# Patient Record
Sex: Female | Born: 1995 | Hispanic: Yes | Smoking: Light tobacco smoker
Health system: Southern US, Community
[De-identification: ages and names within clinical notes are randomized; demographics above are authoritative.]

## PROBLEM LIST (undated history)

## (undated) DIAGNOSIS — T7840XA Allergy, unspecified, initial encounter: Secondary | ICD-10-CM

## (undated) DIAGNOSIS — K219 Gastro-esophageal reflux disease without esophagitis: Secondary | ICD-10-CM

## (undated) DIAGNOSIS — F419 Anxiety disorder, unspecified: Secondary | ICD-10-CM

## (undated) DIAGNOSIS — L309 Dermatitis, unspecified: Secondary | ICD-10-CM

## (undated) DIAGNOSIS — E669 Obesity, unspecified: Secondary | ICD-10-CM

## (undated) DIAGNOSIS — J45909 Unspecified asthma, uncomplicated: Secondary | ICD-10-CM

## (undated) DIAGNOSIS — I10 Essential (primary) hypertension: Secondary | ICD-10-CM

## (undated) DIAGNOSIS — F32A Depression, unspecified: Secondary | ICD-10-CM

## (undated) DIAGNOSIS — D689 Coagulation defect, unspecified: Secondary | ICD-10-CM

## (undated) DIAGNOSIS — D649 Anemia, unspecified: Secondary | ICD-10-CM

## (undated) HISTORY — DX: Dermatitis, unspecified: L30.9

## (undated) HISTORY — DX: Anemia, unspecified: D64.9

## (undated) HISTORY — PX: HERNIA REPAIR: SHX51

## (undated) HISTORY — DX: Anxiety disorder, unspecified: F41.9

## (undated) HISTORY — DX: Depression, unspecified: F32.A

## (undated) HISTORY — DX: Allergy, unspecified, initial encounter: T78.40XA

## (undated) HISTORY — DX: Essential (primary) hypertension: I10

## (undated) HISTORY — DX: Gastro-esophageal reflux disease without esophagitis: K21.9

## (undated) HISTORY — DX: Coagulation defect, unspecified: D68.9

## (undated) HISTORY — DX: Unspecified asthma, uncomplicated: J45.909

---
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
## 2017-08-18 ENCOUNTER — Emergency Department (HOSPITAL_COMMUNITY)
Admission: EM | Admit: 2017-08-18 | Discharge: 2017-08-18 | Disposition: A | Discharge status: Home / Self Care | Payer: Self-pay | Attending: Emergency Medicine | Admitting: Emergency Medicine

## 2017-08-18 ENCOUNTER — Encounter (HOSPITAL_COMMUNITY): Payer: Self-pay | Admitting: *Deleted

## 2017-08-18 DIAGNOSIS — L0291 Cutaneous abscess, unspecified: Secondary | ICD-10-CM

## 2017-08-18 DIAGNOSIS — F17200 Nicotine dependence, unspecified, uncomplicated: Secondary | ICD-10-CM | POA: Insufficient documentation

## 2017-08-18 DIAGNOSIS — Z79899 Other long term (current) drug therapy: Secondary | ICD-10-CM | POA: Insufficient documentation

## 2017-08-18 DIAGNOSIS — L0201 Cutaneous abscess of face: Secondary | ICD-10-CM | POA: Insufficient documentation

## 2017-08-18 HISTORY — DX: Obesity, unspecified: E66.9

## 2017-08-18 MED ORDER — SULFAMETHOXAZOLE-TRIMETHOPRIM 800-160 MG PO TABS
2.0000 | ORAL_TABLET | Freq: Once | ORAL | Status: AC
  Administered 2017-08-18: 2 via ORAL
  Filled 2017-08-18: qty 2

## 2017-08-18 MED ORDER — LIDOCAINE HCL 2 % IJ SOLN
20.0000 mL | Freq: Once | INTRAMUSCULAR | Status: AC
  Administered 2017-08-18: 400 mg
  Filled 2017-08-18: qty 20

## 2017-08-18 MED ORDER — SULFAMETHOXAZOLE-TRIMETHOPRIM 800-160 MG PO TABS: 1.0000 | ORAL_TABLET | Freq: Two times a day (BID) | ORAL | 0 refills | Status: AC

## 2017-08-18 MED ORDER — OXYCODONE-ACETAMINOPHEN 5-325 MG PO TABS
1.0000 | ORAL_TABLET | Freq: Once | ORAL | Status: AC
Start: 2017-08-18 — End: 2017-08-18
  Administered 2017-08-18: 1 via ORAL
  Filled 2017-08-18: qty 1

## 2017-08-18 MED ORDER — OXYCODONE-ACETAMINOPHEN 5-325 MG PO TABS: 1.0000 | ORAL_TABLET | ORAL | 0 refills | Status: DC | PRN

## 2017-08-18 NOTE — ED Provider Notes (Signed)
[facility] EMERGENCY DEPARTMENT Provider Note   CSN: [id number] Arrival date & time: 08/18/17  1732     History   Chief Complaint Chief Complaint  Patient presents with  . Abscess    HPI Tyreshia Troutman is a 21 y.o. female.  HPI   21 year old female presents today with 3-day history of abscess.  Patient notes she had a pimple under her left nostril.  She notes she attempted popping it causing worsening swelling and pain.  She notes swelling down into her lip, denies any intraoral involvement.  She reports generalized headache, denies any fever chills nausea or vomiting.  No history of the same, no chronic health conditions, patient is not a diabetic.  No medications prior to arrival.  Past Medical History:  Diagnosis Date  . Obesity     There are no active problems to display for this patient.   History reviewed. No pertinent surgical history.  OB History    No data available       Home Medications    Prior to Admission medications   Medication Sig Start Date End Date Taking? Authorizing Provider  Aspirin-Salicylamide-Caffeine (BC FAST PAIN RELIEF) 650-195-33.3 MG PACK Take 1 application by mouth daily as needed.   Yes [provider]  Biotin 10 MG CAPS Take 10 mg by mouth daily.   Yes [provider]  diphenhydrAMINE (BENADRYL) 25 MG tablet Take 50 mg by mouth every 6 (six) hours as needed for itching.   Yes [provider]  ibuprofen (ADVIL,MOTRIN) 200 MG tablet Take 600 mg by mouth every 6 (six) hours as needed.   Yes [provider]  oxyCODONE-acetaminophen (PERCOCET/ROXICET) 5-325 MG tablet Take 1-2 tablets by mouth every 4 (four) hours as needed for severe pain. 08/18/17   Rishan Oyama, Tinnie Gens, PA-C  sulfamethoxazole-trimethoprim (BACTRIM DS,SEPTRA DS) 800-160 MG tablet Take 1 tablet by mouth 2 (two) times daily. 08/18/17 08/25/17  Eyvonne Mechanic, PA-C    Family History History reviewed. No pertinent family  history.  Social History Social History  Substance Use Topics  . Smoking status: Current Every Day Smoker  . Smokeless tobacco: Not on file  . Alcohol use No     Allergies   Patient has no known allergies.   Review of Systems Review of Systems  All other systems reviewed and are negative.    Physical Exam Updated Vital Signs BP 131/81 (BP Location: Left Arm)   Pulse 91   Temp 98.9 F (37.2 C) (Oral)   Resp (!) 98   SpO2 98%   Physical Exam  Constitutional: She is oriented to person, place, and time. She appears well-developed and well-nourished.  HENT:  Head: Normocephalic and atraumatic.  Eyes: Pupils are equal, round, and reactive to light. Conjunctivae are normal. Right eye exhibits no discharge. Left eye exhibits no discharge. No scleral icterus.  Neck: Normal range of motion. No JVD present. No tracheal deviation present.  Pulmonary/Chest: Effort normal. No stridor.  Neurological: She is alert and oriented to person, place, and time. Coordination normal.  Skin:  1.5 centimeters area of induration with minor redness under the left nostril-no intraoral involvement-no swelling to the face or peri-orbital structures-no purulent discharge  Psychiatric: She has a normal mood and affect. Her behavior is normal. Judgment and thought content normal.  Nursing note and vitals reviewed.    ED Treatments / Results  Labs (all labs ordered are listed, but only abnormal results are displayed) Labs Reviewed - No data to display  EKG  EKG Interpretation None       Radiology No results found.  Procedures .Marland Kitchen.Incision and Drainage Date/Time: 08/18/2017 8:50 PM Performed by: Curlene DolphinHEDGES, Jasher Barkan Authorized by: Curlene DolphinHEDGES, Lonnie Reth   Consent:    Consent obtained:  Verbal   Consent given by:  Patient   Risks discussed:  Pain and bleeding   Alternatives discussed:  No treatment, delayed treatment, observation and alternative treatment Location:    Type:  Abscess   Size:   1.5   Location:  Head   Head location:  Face Pre-procedure details:    Procedure prep: alcohol prep. Anesthesia (see MAR for exact dosages):    Anesthesia method:  Local infiltration   Local anesthetic:  Lidocaine 2% w/o epi Procedure type:    Complexity:  Simple Procedure details:    Incision types:  Stab incision   Incision depth:  Dermal   Scalpel blade:  11   Wound management:  Probed and deloculated   Drainage:  Bloody   Drainage amount:  Scant   Wound treatment:  Wound left open   Packing materials:  None Post-procedure details:    Patient tolerance of procedure:  Tolerated well, no immediate complications   (including critical care time)  Medications Ordered in ED Medications  sulfamethoxazole-trimethoprim (BACTRIM DS,SEPTRA DS) 800-160 MG per tablet 2 tablet (not administered)  lidocaine (XYLOCAINE) 2 % (with pres) injection 400 mg (400 mg Infiltration Given by Other 08/18/17 1950)  oxyCODONE-acetaminophen (PERCOCET/ROXICET) 5-325 MG per tablet 1 tablet (1 tablet Oral Given 08/18/17 1954)     Initial Impression / Assessment and Plan / ED Course  I have reviewed the triage vital signs and the nursing notes.  Pertinent labs & imaging results that were available during my care of the patient were reviewed by me and considered in my medical decision making (see chart for details).      Final Clinical Impressions(s) / ED Diagnoses   Final diagnoses:  Abscess   21 year old female presents today with an abscess to her face.  She has minor redness, no surrounding redness.  I&D showed no purulent drainage.  Patient has no spread throughout the face, very unlikely to have any extension into the cavernous sinus, she is afebrile nontoxic in no acute distress.  Patient will be started on Bactrim, she will follow-up closely in the ED with any worsening signs or symptoms.  Both the patient and her mother verbalized understanding and agreement to today's plan had no further  questions or concerns the time discharge.   Cavernous sinus  New Prescriptions New Prescriptions   OXYCODONE-ACETAMINOPHEN (PERCOCET/ROXICET) 5-325 MG TABLET    Take 1-2 tablets by mouth every 4 (four) hours as needed for severe pain.   SULFAMETHOXAZOLE-TRIMETHOPRIM (BACTRIM DS,SEPTRA DS) 800-160 MG TABLET    Take 1 tablet by mouth 2 (two) times daily.     Eyvonne MechanicHedges, Sarely Stracener, PA-C 08/18/17 2053    Rolland PorterJames, Mark, MD 08/23/17 0001

## 2017-08-18 NOTE — Discharge Instructions (Signed)
Please read attached information. If you experience any new or worsening signs or symptoms please return to the emergency room for evaluation. Please follow-up with your primary care provider or specialist as discussed. Please use medication prescribed only as directed and discontinue taking if you have any concerning signs or symptoms.   °

## 2017-08-18 NOTE — ED Notes (Signed)
Pt stable,ambulatory, and verbalizes understanding of D/C instructions.   

## 2017-08-18 NOTE — ED Triage Notes (Signed)
Pt reports having a bump near her nose for several days, has gotten bigger and has swelling to her upper lip and difficulty eating. Denies fever. airway intact.

## 2017-08-20 ENCOUNTER — Encounter (HOSPITAL_COMMUNITY): Payer: Self-pay | Admitting: Emergency Medicine

## 2017-08-20 ENCOUNTER — Emergency Department (HOSPITAL_COMMUNITY)
Admission: EM | Admit: 2017-08-20 | Discharge: 2017-08-20 | Disposition: A | Discharge status: Home / Self Care | Payer: Self-pay | Attending: Emergency Medicine | Admitting: Emergency Medicine

## 2017-08-20 DIAGNOSIS — L0291 Cutaneous abscess, unspecified: Secondary | ICD-10-CM

## 2017-08-20 DIAGNOSIS — Z79899 Other long term (current) drug therapy: Secondary | ICD-10-CM | POA: Insufficient documentation

## 2017-08-20 DIAGNOSIS — L03211 Cellulitis of face: Secondary | ICD-10-CM | POA: Insufficient documentation

## 2017-08-20 DIAGNOSIS — F17200 Nicotine dependence, unspecified, uncomplicated: Secondary | ICD-10-CM | POA: Insufficient documentation

## 2017-08-20 DIAGNOSIS — L0201 Cutaneous abscess of face: Secondary | ICD-10-CM | POA: Insufficient documentation

## 2017-08-20 LAB — COMPREHENSIVE METABOLIC PANEL
ALBUMIN: 3.4 g/dL — AB (ref 3.5–5.0)
ALT: 18 U/L (ref 14–54)
ANION GAP: 6 (ref 5–15)
AST: 34 U/L (ref 15–41)
Alkaline Phosphatase: 97 U/L (ref 38–126)
BUN: 12 mg/dL (ref 6–20)
CHLORIDE: 105 mmol/L (ref 101–111)
CO2: 25 mmol/L (ref 22–32)
Calcium: 8.9 mg/dL (ref 8.9–10.3)
Creatinine, Ser: 0.72 mg/dL (ref 0.44–1.00)
GFR calc Af Amer: >60 mL/min (ref >60)
GFR calc non Af Amer: >60 mL/min (ref >60)
GLUCOSE: 95 mg/dL (ref 65–99)
POTASSIUM: 5.2 mmol/L — AB (ref 3.5–5.1)
SODIUM: 136 mmol/L (ref 135–145)
Total Bilirubin: 1.5 mg/dL — ABNORMAL HIGH (ref 0.3–1.2)
Total Protein: 6.7 g/dL (ref 6.5–8.1)

## 2017-08-20 LAB — CBC WITH DIFFERENTIAL/PLATELET
BASOS PCT: 0 %
Basophils Absolute: 0 10*3/uL (ref 0.0–0.1)
EOS ABS: 0.1 10*3/uL (ref 0.0–0.7)
EOS PCT: 1 %
HCT: 43.5 % (ref 36.0–46.0)
HEMOGLOBIN: 14.1 g/dL (ref 12.0–15.0)
Lymphocytes Relative: 11 %
Lymphs Abs: 1.8 10*3/uL (ref 0.7–4.0)
MCH: 28.9 pg (ref 26.0–34.0)
MCHC: 32.4 g/dL (ref 30.0–36.0)
MCV: 89.1 fL (ref 78.0–100.0)
MONOS PCT: 5 %
Monocytes Absolute: 0.8 10*3/uL (ref 0.1–1.0)
NEUTROS ABS: 13.9 10*3/uL — AB (ref 1.7–7.7)
Neutrophils Relative %: 83 %
PLATELETS: 198 10*3/uL (ref 150–400)
RBC: 4.88 MIL/uL (ref 3.87–5.11)
RDW: 16.3 % — AB (ref 11.5–15.5)
WBC: 16.6 10*3/uL — ABNORMAL HIGH (ref 4.0–10.5)

## 2017-08-20 LAB — I-STAT CG4 LACTIC ACID, ED: Lactic Acid, Venous: 1.28 mmol/L (ref 0.5–1.9)

## 2017-08-20 MED ORDER — ACETAMINOPHEN 500 MG PO TABS
1000.0000 mg | ORAL_TABLET | Freq: Once | ORAL | Status: AC
  Administered 2017-08-20: 1000 mg via ORAL

## 2017-08-20 MED ORDER — CEFTRIAXONE SODIUM 1 G IJ SOLR: 1.0000 g | Freq: Once | INTRAMUSCULAR | 0 refills | Status: AC

## 2017-08-20 MED ORDER — LIDOCAINE HCL (PF) 1 % IJ SOLN
INTRAMUSCULAR | Status: AC
  Administered 2017-08-20: 2.1 mL
  Filled 2017-08-20: qty 5

## 2017-08-20 MED ORDER — CEFTRIAXONE SODIUM 1 G IJ SOLR: 1.0000 g | Freq: Once | INTRAMUSCULAR | Status: DC

## 2017-08-20 MED ORDER — HYDROCODONE-ACETAMINOPHEN 5-325 MG PO TABS: 2.0000 | ORAL_TABLET | ORAL | 0 refills | Status: DC | PRN

## 2017-08-20 MED ORDER — LIDOCAINE HCL (PF) 1 % IJ SOLN
INTRAMUSCULAR | Status: AC
  Administered 2017-08-20: 5 mL
  Filled 2017-08-20: qty 5

## 2017-08-20 MED ORDER — ACETAMINOPHEN 500 MG PO TABS
ORAL_TABLET | ORAL | Status: AC
  Filled 2017-08-20: qty 2

## 2017-08-20 MED ORDER — CEFTRIAXONE SODIUM 1 G IJ SOLR
1.0000 g | Freq: Once | INTRAMUSCULAR | Status: AC
  Administered 2017-08-20: 1 g via INTRAMUSCULAR
  Filled 2017-08-20: qty 10

## 2017-08-20 NOTE — ED Notes (Signed)
Pt departed in NAD, refused use of wheelchair.  

## 2017-08-20 NOTE — Discharge Instructions (Signed)
Return if any problems.  Warm compresses 20 minutes 4 times a day.  Recheck at Urgent care tomorrow.  Continue antibiotics and pain medications.

## 2017-08-20 NOTE — ED Triage Notes (Signed)
Pt reports having abscess at base of L Nare drained at this ED on Wednesday, reports taking abx, but swelling worse, spreading to bilat cheeks.  Pt afebrile at triage.

## 2017-08-20 NOTE — ED Provider Notes (Signed)
[facility] Provider Note   CSN: [id number] Arrival date & time: 08/20/17  1314     History   Chief Complaint Chief Complaint  Patient presents with  . Facial Swelling    HPI Denise Beltran is a 21 y.o. female.  The history is provided by the patient. No language interpreter was used.  Abscess  Location:  Face Facial abscess location:  Face and nose Size:  2 Abscess quality: painful, redness and warmth   Red streaking: no   Duration:  4 days Progression:  Worsening Pain details:    Quality:  Throbbing and pressure   Severity:  Moderate   Timing:  Constant   Progression:  Worsening Chronicity:  New Relieved by:  Nothing Worsened by:  Nothing Ineffective treatments:  None tried  Pt had I and D 2 days ago.  Pt reports nothing drained out.  Pt reports increased swelling and pain.   Past Medical History:  Diagnosis Date  . Obesity     There are no active problems to display for this patient.   History reviewed. No pertinent surgical history.  OB History    No data available       Home Medications    Prior to Admission medications   Medication Sig Start Date End Date Taking? Authorizing Provider  Aspirin-Salicylamide-Caffeine (BC FAST PAIN RELIEF) 650-195-33.3 MG PACK Take 1 application by mouth daily as needed.    [provider]  Biotin 10 MG CAPS Take 10 mg by mouth daily.    [provider]  cefTRIAXone (ROCEPHIN) 1 g injection Inject 1 g into the muscle once. 08/20/17 08/20/17  Elson AreasSofia, Katelin Kutsch K, PA-C  diphenhydrAMINE (BENADRYL) 25 MG tablet Take 50 mg by mouth every 6 (six) hours as needed for itching.    [provider]  HYDROcodone-acetaminophen (NORCO/VICODIN) 5-325 MG tablet Take 2 tablets by mouth every 4 (four) hours as needed. 08/20/17   Elson AreasSofia, Stevana Dufner K, PA-C  ibuprofen (ADVIL,MOTRIN) 200 MG tablet Take 600 mg by mouth every 6 (six) hours as needed.    [provider]    oxyCODONE-acetaminophen (PERCOCET/ROXICET) 5-325 MG tablet Take 1-2 tablets by mouth every 4 (four) hours as needed for severe pain. 08/18/17   Hedges, Tinnie GensJeffrey, PA-C  sulfamethoxazole-trimethoprim (BACTRIM DS,SEPTRA DS) 800-160 MG tablet Take 1 tablet by mouth 2 (two) times daily. 08/18/17 08/25/17  Eyvonne MechanicHedges, Jeffrey, PA-C    Family History No family history on file.  Social History Social History  Substance Use Topics  . Smoking status: Current Every Day Smoker  . Smokeless tobacco: Not on file  . Alcohol use No     Allergies   Patient has no known allergies.   Review of Systems Review of Systems  All other systems reviewed and are negative.    Physical Exam Updated Vital Signs BP (!) 132/94 (BP Location: Right Arm)   Pulse 76   Temp 98.9 F (37.2 C) (Oral)   Resp 16   SpO2 100%   Physical Exam  Constitutional: She appears well-developed and well-nourished. No distress.  HENT:  Head: Normocephalic and atraumatic.  2cm area of swelling under left nostril    Eyes: Conjunctivae are normal.  Neck: Neck supple.  Cardiovascular: Normal rate.   No murmur heard. Pulmonary/Chest: Effort normal. No respiratory distress.  Abdominal: There is no tenderness.  Musculoskeletal: She exhibits no edema.  Neurological: She is alert.  Skin: Skin is warm and dry.  Psychiatric: She has a normal mood and  affect.  Nursing note and vitals reviewed.    ED Treatments / Results  Labs (all labs ordered are listed, but only abnormal results are displayed) Labs Reviewed  COMPREHENSIVE METABOLIC PANEL - Abnormal; Notable for the following:       Result Value   Potassium 5.2 (*)    Albumin 3.4 (*)    Total Bilirubin 1.5 (*)    All other components within normal limits  CBC WITH DIFFERENTIAL/PLATELET - Abnormal; Notable for the following:    WBC 16.6 (*)    RDW 16.3 (*)    Neutro Abs 13.9 (*)    All other components within normal limits  I-STAT CG4 LACTIC ACID, ED  I-STAT CG4  LACTIC ACID, ED    EKG  EKG Interpretation None       Radiology No results found.  Procedures .Marland KitchenIncision and Drainage Date/Time: 08/20/2017 8:07 PM Performed by: Elson Areas Authorized by: Elson Areas   Consent:    Consent obtained:  Verbal   Consent given by:  Patient   Risks discussed:  Incomplete drainage   Alternatives discussed:  No treatment Location:    Type:  Abscess   Size:  2 Pre-procedure details:    Skin preparation:  Betadine Anesthesia (see MAR for exact dosages):    Anesthesia method:  None Procedure type:    Complexity:  Simple Procedure details:    Incision types:  Single straight   Incision depth:  Subcutaneous   Scalpel blade:  11   Wound management:  Probed and deloculated   Drainage:  Purulent   Wound treatment:  Wound left open Post-procedure details:    Patient tolerance of procedure:  Tolerated well, no immediate complications   (including critical care time)  Medications Ordered in ED Medications  acetaminophen (TYLENOL) 500 MG tablet (not administered)  lidocaine (PF) (XYLOCAINE) 1 % injection (not administered)  cefTRIAXone (ROCEPHIN) injection 1 g (not administered)  acetaminophen (TYLENOL) tablet 1,000 mg (1,000 mg Oral Given 08/20/17 1356)     Initial Impression / Assessment and Plan / ED Course  I have reviewed the triage vital signs and the nursing notes.  Pertinent labs & imaging results that were available during my care of the patient were reviewed by me and considered in my medical decision making (see chart for details).      Final Clinical Impressions(s) / ED Diagnoses   Final diagnoses:  Facial cellulitis  Abscess    New Prescriptions New Prescriptions   CEFTRIAXONE (ROCEPHIN) 1 G INJECTION    Inject 1 g into the muscle once.   HYDROCODONE-ACETAMINOPHEN (NORCO/VICODIN) 5-325 MG TABLET    Take 2 tablets by mouth every 4 (four) hours as needed.  An After Visit Summary was printed and given to the  patient.    Elson Areas, [place] 08/20/17 2011    Shon Baton, MD 08/20/17 [phone]

## 2017-08-21 ENCOUNTER — Telehealth: Payer: Self-pay | Admitting: *Deleted

## 2017-08-21 NOTE — Telephone Encounter (Signed)
Rx changed per Pharm D recommendation

## 2018-03-08 ENCOUNTER — Ambulatory Visit (INDEPENDENT_AMBULATORY_CARE_PROVIDER_SITE_OTHER): Payer: Self-pay | Admitting: Physician Assistant

## 2018-03-08 ENCOUNTER — Encounter (INDEPENDENT_AMBULATORY_CARE_PROVIDER_SITE_OTHER): Payer: Self-pay | Admitting: Physician Assistant

## 2018-03-08 VITALS — BP 123/82 | HR 62 | Temp 97.9°F | Resp 18 | Ht 63.0 in | Wt 305.0 lb

## 2018-03-08 DIAGNOSIS — R600 Localized edema: Secondary | ICD-10-CM

## 2018-03-08 DIAGNOSIS — Z114 Encounter for screening for human immunodeficiency virus [HIV]: Secondary | ICD-10-CM

## 2018-03-08 DIAGNOSIS — L309 Dermatitis, unspecified: Secondary | ICD-10-CM

## 2018-03-08 MED ORDER — CETAPHIL MOISTURIZING EX CREA: TOPICAL_CREAM | CUTANEOUS | 0 refills | Status: DC | PRN

## 2018-03-08 MED ORDER — TRIAMCINOLONE ACETONIDE 0.1 % EX CREA: 1.0000 "application " | TOPICAL_CREAM | Freq: Two times a day (BID) | CUTANEOUS | 1 refills | Status: DC

## 2018-03-08 NOTE — Patient Instructions (Signed)
Edema Edema is when you have too much fluid in your body or under your skin. Edema may make your legs, feet, and ankles swell up. Swelling is also common in looser tissues, like around your eyes. This is a common condition. It gets more common as you get older. There are many possible causes of edema. Eating too much salt (sodium) and being on your feet or sitting for a long time can cause edema in your legs, feet, and ankles. Hot weather may make edema worse. Edema is usually painless. Your skin may look swollen or shiny. Follow these instructions at home:  Keep the swollen body part raised (elevated) above the level of your heart when you are sitting or lying down.  Do not sit still or stand for a long time.  Do not wear tight clothes. Do not wear garters on your upper legs.  Exercise your legs. This can help the swelling go down.  Wear elastic bandages or support stockings as told by your doctor.  Eat a low-salt (low-sodium) diet to reduce fluid as told by your doctor.  Depending on the cause of your swelling, you may need to limit how much fluid you drink (fluid restriction).  Take over-the-counter and prescription medicines only as told by your doctor. Contact a doctor if:  Treatment is not working.  You have heart, liver, or kidney disease and have symptoms of edema.  You have sudden and unexplained weight gain. Get help right away if:  You have shortness of breath or chest pain.  You cannot breathe when you lie down.  You have pain, redness, or warmth in the swollen areas.  You have heart, liver, or kidney disease and get edema all of a sudden.  You have a fever and your symptoms get worse all of a sudden. Summary  Edema is when you have too much fluid in your body or under your skin.  Edema may make your legs, feet, and ankles swell up. Swelling is also common in looser tissues, like around your eyes.  Raise (elevate) the swollen body part above the level of your  heart when you are sitting or lying down.  Follow your doctor's instructions about diet and how much fluid you can drink (fluid restriction). This information is not intended to replace advice given to you by your health care provider. Make sure you discuss any questions you have with your health care provider. Document Released: 04/06/2008 Document Revised: 11/06/2016 Document Reviewed: 11/06/2016 Elsevier Interactive Patient Education  2017 [employer]vier Inc[employer]  

## 2018-03-08 NOTE — Progress Notes (Signed)
Subjective:  Patient ID: Denise Beltran, female    DOB: Aug 27, 1996  Age: 22 y.o. MRN: [id number]  CC: left foot swelling  HPI Denise Beltran is a 22 y.o. female with a medical history of asthma and obesity presents with waxing and waning left ankle swelling for approximately one month. Associated with occasional pitting edema of the LLE. Edema usually occurs after prolonged standing. Does not endorse CP, palpitations, current SOB, HA, abdominal pain, or GI/GU sxs.      Would also like a prescription for her eczema. Has not used any prescription medication for eczema since she was in middle school. Using Aveeno for moisturizer.         Outpatient Medications Prior to Visit  Medication Sig Dispense Refill  . Aspirin-Salicylamide-Caffeine (BC FAST PAIN RELIEF) 650-195-33.3 MG PACK Take 1 application by mouth daily as needed.    . diphenhydrAMINE (BENADRYL) 25 MG tablet Take 50 mg by mouth every 6 (six) hours as needed for itching.    Marland Kitchen ibuprofen (ADVIL,MOTRIN) 200 MG tablet Take 600 mg by mouth every 6 (six) hours as needed.    . Biotin 10 MG CAPS Take 10 mg by mouth daily.    Marland Kitchen HYDROcodone-acetaminophen (NORCO/VICODIN) 5-325 MG tablet Take 2 tablets by mouth every 4 (four) hours as needed. 16 tablet 0  . oxyCODONE-acetaminophen (PERCOCET/ROXICET) 5-325 MG tablet Take 1-2 tablets by mouth every 4 (four) hours as needed for severe pain. 6 tablet 0   No facility-administered medications prior to visit.      ROS Review of Systems  Constitutional: Negative for chills, fever and malaise/fatigue.  Eyes: Negative for blurred vision.  Respiratory: Negative for shortness of breath.   Cardiovascular: Negative for chest pain and palpitations.  Gastrointestinal: Negative for abdominal pain and nausea.  Genitourinary: Negative for dysuria and hematuria.  Musculoskeletal: Negative for joint pain and myalgias.  Skin: Negative for rash.  Neurological: Negative for tingling and headaches.   Psychiatric/Behavioral: Negative for depression. The patient is not nervous/anxious.     Objective:  BP 123/82 (BP Location: Left Arm, Patient Position: Sitting, Cuff Size: Large)   Pulse 62   Temp 97.9 F (36.6 C) (Oral)   Resp 18   Ht  (1.6 m)   Wt (!) 305 lb (138.3 kg)   LMP 03/08/2018   SpO2 98%   BMI 54.03 kg/m   BP/Weight 03/08/2018 08/20/2017 08/18/2017  Systolic BP 123 132 131  Diastolic BP 82 94 81  Wt. (Lbs) 305 - -  BMI 54.03 - -      Physical Exam  Constitutional: She is oriented to person, place, and time.  Well developed, well nourished, NAD, polite  HENT:  Head: Normocephalic and atraumatic.  Eyes: No scleral icterus.  Neck: Normal range of motion. Neck supple. No thyromegaly present.  Cardiovascular: Normal rate, regular rhythm, normal heart sounds and intact distal pulses. Exam reveals no friction rub.  No murmur heard. No LE edema bilaterally  Pulmonary/Chest: Effort normal and breath sounds normal.  Abdominal: Soft. Bowel sounds are normal. There is no tenderness.  Musculoskeletal: She exhibits no edema.  Neurological: She is alert and oriented to person, place, and time.  Skin: Skin is warm and dry. No rash noted. No erythema. No pallor.  Psychiatric: She has a normal mood and affect. Her behavior is normal. Thought content normal.  Vitals reviewed.    Assessment & Plan:    1. Lower extremity edema - Comprehensive metabolic panel - CBC with Differential - Lipid  panel - TSH  2. Eczema, unspecified type - triamcinolone cream (KENALOG) 0.1 %; Apply 1 application topically 2 (two) times daily.  Dispense: 80 g; Refill: 1 - Emollient (CETAPHIL) cream; Apply topically as needed.  Dispense: 90 g; Refill: 0  3. Morbid obesity (HCC) - Comprehensive metabolic panel - CBC with Differential - Lipid panel - TSH  4. Screening for HIV (human immunodeficiency virus) - HIV antibody   Meds ordered this encounter  Medications  . triamcinolone  cream (KENALOG) 0.1 %    Sig: Apply 1 application topically 2 (two) times daily.    Dispense:  80 g    Refill:  1    Order Specific Question:   Supervising Provider    Answer:   Quentin Angst [id number]  . Emollient (CETAPHIL) cream    Sig: Apply topically as needed.    Dispense:  90 g    Refill:  0    Order Specific Question:   Supervising Provider    Answer:   Quentin Angst [[id number]]    Follow-up:  4 weeks  Loletta Specter PA

## 2018-03-09 LAB — CBC WITH DIFFERENTIAL/PLATELET
BASOS: 0 %
Basophils Absolute: 0 10*3/uL (ref 0.0–0.2)
EOS (ABSOLUTE): 0.1 10*3/uL (ref 0.0–0.4)
EOS: 1 %
HEMATOCRIT: 41.9 % (ref 34.0–46.6)
HEMOGLOBIN: 13.9 g/dL (ref 11.1–15.9)
IMMATURE GRANS (ABS): 0.3 10*3/uL — AB (ref 0.0–0.1)
IMMATURE GRANULOCYTES: 2 %
LYMPHS: 14 %
Lymphocytes Absolute: 1.7 10*3/uL (ref 0.7–3.1)
MCH: 29.3 pg (ref 26.6–33.0)
MCHC: 33.2 g/dL (ref 31.5–35.7)
MCV: 88 fL (ref 79–97)
Monocytes Absolute: 0.6 10*3/uL (ref 0.1–0.9)
Monocytes: 5 %
NEUTROS ABS: 9.4 10*3/uL — AB (ref 1.4–7.0)
NEUTROS PCT: 78 %
PLATELETS: 125 10*3/uL — AB (ref 150–379)
RBC: 4.74 x10E6/uL (ref 3.77–5.28)
RDW: 16.4 % — ABNORMAL HIGH (ref 12.3–15.4)
WBC: 12.1 10*3/uL — ABNORMAL HIGH (ref 3.4–10.8)

## 2018-03-09 LAB — COMPREHENSIVE METABOLIC PANEL
A/G RATIO: 1.6 (ref 1.2–2.2)
ALT: 25 IU/L (ref 0–32)
AST: 18 IU/L (ref 0–40)
Albumin: 4.3 g/dL (ref 3.5–5.5)
Alkaline Phosphatase: 99 IU/L (ref 39–117)
BUN/Creatinine Ratio: 23 (ref 9–23)
BUN: 13 mg/dL (ref 6–20)
CO2: 21 mmol/L (ref 20–29)
CREATININE: 0.57 mg/dL (ref 0.57–1.00)
Calcium: 9.2 mg/dL (ref 8.7–10.2)
Chloride: 103 mmol/L (ref 96–106)
GFR, EST AFRICAN AMERICAN: 150 mL/min/{1.73_m2} (ref >59)
GFR, EST NON AFRICAN AMERICAN: 130 mL/min/{1.73_m2} (ref >59)
Globulin, Total: 2.7 g/dL (ref 1.5–4.5)
Glucose: 68 mg/dL (ref 65–99)
POTASSIUM: 4.2 mmol/L (ref 3.5–5.2)
Sodium: 144 mmol/L (ref 134–144)
TOTAL PROTEIN: 7 g/dL (ref 6.0–8.5)

## 2018-03-09 LAB — HIV ANTIBODY (ROUTINE TESTING W REFLEX): HIV Screen 4th Generation wRfx: NONREACTIVE

## 2018-03-09 LAB — LIPID PANEL
CHOL/HDL RATIO: 4.9 ratio — AB (ref 0.0–4.4)
Cholesterol, Total: 227 mg/dL — ABNORMAL HIGH (ref 100–199)
HDL: 46 mg/dL (ref >39)
LDL Calculated: 153 mg/dL — ABNORMAL HIGH (ref 0–99)
TRIGLYCERIDES: 140 mg/dL (ref 0–149)
VLDL Cholesterol Cal: 28 mg/dL (ref 5–40)

## 2018-03-09 LAB — TSH: TSH: 1.13 u[IU]/mL (ref 0.450–4.500)

## 2018-03-18 ENCOUNTER — Telehealth (INDEPENDENT_AMBULATORY_CARE_PROVIDER_SITE_OTHER): Payer: Self-pay

## 2018-03-18 NOTE — Telephone Encounter (Signed)
Patient is aware of the following results, Thyroid level is normal. Tests show increased cholesterol/lipid levels. Patient should work on a low fat, heart healthy diet and participate in regular aerobic exercise program to control as well by working out at least  150 minutes per week. No fried foods. No fried foods, junk foods, sodas, sugary drinks, unhealthy snacking, or smoking. You can also take one capsule of Omega 3 fish oil daily to help lower your cholesterol.  HIV test was negative. Maryjean Morn, CMA

## 2018-03-18 NOTE — Telephone Encounter (Signed)
-----   Message from Claiborne Rigg, NP sent at 03/14/2018  8:57 AM EDT ----- Thyroid level is normal. Tests show increased cholesterol/lipid levels.  Patient should work on a low fat, heart healthy diet and participate in regular aerobic exercise program to control as well by working out at least 150 minutes per week. No fried foods. No fried foods, junk foods, sodas, sugary drinks, unhealthy snacking, or smoking. You can also take one capsule of Omega 3 fish oil daily to help lower your cholesterol.  HIV test was negative.

## 2019-06-14 ENCOUNTER — Other Ambulatory Visit: Payer: Self-pay

## 2019-06-14 DIAGNOSIS — Z20822 Contact with and (suspected) exposure to covid-19: Secondary | ICD-10-CM

## 2019-06-15 LAB — NOVEL CORONAVIRUS, NAA: SARS-CoV-2, NAA: NOT DETECTED

## 2019-07-13 ENCOUNTER — Other Ambulatory Visit: Payer: Self-pay

## 2019-07-13 DIAGNOSIS — Z20822 Contact with and (suspected) exposure to covid-19: Secondary | ICD-10-CM

## 2019-07-14 LAB — NOVEL CORONAVIRUS, NAA: SARS-CoV-2, NAA: NOT DETECTED

## 2019-07-17 ENCOUNTER — Ambulatory Visit: Payer: Self-pay

## 2019-07-17 NOTE — Telephone Encounter (Signed)
Provided covid-19 test results. Voiced understanding.

## 2022-09-21 ENCOUNTER — Emergency Department
Admission: EM | Admit: 2022-09-21 | Discharge: 2022-09-21 | Disposition: A | Discharge status: Home / Self Care | Payer: No Typology Code available for payment source | Attending: Emergency Medicine | Admitting: Emergency Medicine

## 2022-09-21 ENCOUNTER — Encounter: Payer: Self-pay | Admitting: Emergency Medicine

## 2022-09-21 DIAGNOSIS — S81812A Laceration without foreign body, left lower leg, initial encounter: Secondary | ICD-10-CM | POA: Diagnosis not present

## 2022-09-21 DIAGNOSIS — X58XXXA Exposure to other specified factors, initial encounter: Secondary | ICD-10-CM | POA: Insufficient documentation

## 2022-09-21 DIAGNOSIS — S8992XA Unspecified injury of left lower leg, initial encounter: Secondary | ICD-10-CM | POA: Diagnosis not present

## 2022-09-21 MED ORDER — LIDOCAINE HCL 1 % IJ SOLN
10.0000 mL | Freq: Once | INTRAMUSCULAR | Status: AC
  Administered 2022-09-21: 10 mL
  Filled 2022-09-21: qty 10

## 2022-09-21 MED ORDER — CEPHALEXIN 500 MG PO CAPS: 500.0000 mg | ORAL_CAPSULE | Freq: Three times a day (TID) | ORAL | 0 refills | Status: AC

## 2022-09-21 NOTE — Discharge Instructions (Signed)
Take Keflex three times daily for the next seven days. Have sutures removed in ten days.  

## 2022-09-21 NOTE — ED Provider Notes (Signed)
[facility] Provider Note  Patient Contact: 9:32 PM (approximate)   History   Extremity Laceration   HPI  Denise Beltran is a 26 y.o. female department with a 6 cm linear laceration along the left lower extremity deep to underlying adipose tissue.  Patient reports that her tetanus status is up-to-date.  Patient states that she sustained laceration accidentally on a conveyor belt.  No numbness or tingling.  No other alleviating measures have been attempted.      Physical Exam   Triage Vital Signs: ED Triage Vitals  Enc Vitals Group     BP 09/21/22 2011 (!) 140/99     Pulse Rate 09/21/22 2011 86     Resp 09/21/22 2011 18     Temp 09/21/22 2011 98.7 F (37.1 C)     Temp Source 09/21/22 2011 Oral     SpO2 09/21/22 2011 100 %     Weight 09/21/22 2010 (!) 313 lb 0.9 oz (142 kg)     Height 09/21/22 2010 5\' 3"  (1.6 m)     Head Circumference --      Peak Flow --      Pain Score 09/21/22 2010 0     Pain Loc --      Pain Edu? --      Excl. in GC? --     Most recent vital signs: Vitals:   09/21/22 2011  BP: (!) 140/99  Pulse: 86  Resp: 18  Temp: 98.7 F (37.1 C)  SpO2: 100%     General: Alert and in no acute distress. Eyes:  PERRL. EOMI. Head: No acute traumatic findings ENT:      Nose: No congestion/rhinnorhea.      Mouth/Throat: Mucous membranes are moist.  Neck: No stridor. No cervical spine tenderness to palpation. Cardiovascular:  Good peripheral perfusion Respiratory: Normal respiratory effort without tachypnea or retractions. Lungs CTAB. Good air entry to the bases with no decreased or absent breath sounds. Gastrointestinal: Bowel sounds 4 quadrants. Soft and nontender to palpation. No guarding or rigidity. No palpable masses. No distention. No CVA tenderness. Musculoskeletal: Full range of motion to all extremities.  Neurologic:  No gross focal neurologic deficits are appreciated.  Skin: Patient has a 6 cm linear laceration deep to  underlying adipose tissue. Other:   ED Results / Procedures / Treatments   Labs (all labs ordered are listed, but only abnormal results are displayed) Labs Reviewed - No data to display      PROCEDURES:  Critical Care performed: No  ..Laceration Repair  Date/Time: 09/21/2022 9:33 PM  Performed by: 09/23/2022, PA-C Authorized by: Orvil Feil, PA-C   Consent:    Consent obtained:  Verbal   Risks discussed:  Infection and pain Universal protocol:    Procedure explained and questions answered to patient or proxy's satisfaction: yes     Patient identity confirmed:  Verbally with patient Anesthesia:    Anesthesia method:  Local infiltration   Local anesthetic:  Lidocaine 1% w/o epi Laceration details:    Location:  Leg   Leg location:  L lower leg   Length (cm):  6   Depth (mm):  5 Pre-procedure details:    Preparation:  Patient was prepped and draped in usual sterile fashion Exploration:    Limited defect created (wound extended): no     Contaminated: no   Treatment:    Area cleansed with:  Povidone-iodine   Amount of cleaning:  Standard   Debridement:  None Skin repair:    Repair method:  Sutures   Suture size:  4-0   Suture technique:  Running locked   Number of sutures:  12 Approximation:    Approximation:  Close Repair type:    Repair type:  Simple Post-procedure details:    Dressing:  Non-adherent dressing    MEDICATIONS ORDERED IN ED: Medications  lidocaine (XYLOCAINE) 1 % (with pres) injection 10 mL (10 mLs Infiltration Given by Other 09/21/22 2128)     IMPRESSION / MDM / ASSESSMENT AND PLAN / ED COURSE  I reviewed the triage vital signs and the nursing notes.                              Assessment and plan Leg laceration 26 year old female presents to the emergency department with a left lower leg laceration.  Vital signs are reassuring at triage.  On exam, patient was alert, active and nontoxic-appearing.  Laceration was  repaired without complication and patient was advised to have sutures removed in 10 days.  She was discharged with Keflex.  Her tetanus status is up-to-date.     FINAL CLINICAL IMPRESSION(S) / ED DIAGNOSES   Final diagnoses:  Laceration of left lower extremity, initial encounter     Rx / DC Orders   ED Discharge Orders          Ordered    cephALEXin (KEFLEX) 500 MG capsule  3 times daily        09/21/22 2129             Note:  This document was prepared using Dragon voice recognition software and may include unintentional dictation errors.   Pia Mau [place], Cordelia Poche 09/21/22 2135    Jene Every, MD 09/21/22 2216

## 2022-09-21 NOTE — ED Triage Notes (Signed)
Pt presents via POV with complaints of left lower leg laceration while at work. Pt states she hit the corner of a conveyor belt - mild tissue exposure, bleeding well controlled. Not on thinners. Tetanus UTD. Nothing required from supervisor for workers comp.

## 2022-10-01 DIAGNOSIS — S81812D Laceration without foreign body, left lower leg, subsequent encounter: Secondary | ICD-10-CM | POA: Diagnosis not present

## 2022-10-01 DIAGNOSIS — Z4802 Encounter for removal of sutures: Secondary | ICD-10-CM | POA: Diagnosis not present

## 2022-10-21 ENCOUNTER — Emergency Department: Payer: No Typology Code available for payment source

## 2022-10-21 ENCOUNTER — Encounter: Payer: Self-pay | Admitting: Emergency Medicine

## 2022-10-21 ENCOUNTER — Other Ambulatory Visit: Payer: Self-pay

## 2022-10-21 DIAGNOSIS — X58XXXA Exposure to other specified factors, initial encounter: Secondary | ICD-10-CM | POA: Insufficient documentation

## 2022-10-21 DIAGNOSIS — Z5321 Procedure and treatment not carried out due to patient leaving prior to being seen by health care provider: Secondary | ICD-10-CM | POA: Insufficient documentation

## 2022-10-21 DIAGNOSIS — S61210A Laceration without foreign body of right index finger without damage to nail, initial encounter: Secondary | ICD-10-CM | POA: Insufficient documentation

## 2022-10-21 NOTE — ED Triage Notes (Signed)
Pt to ED from work c/o laceration to distal right pointer finger.  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  Tissue exposed, bleeding controlled, rewrapped in triage.  Filing WC but unsure if UDS needed, states supervisor on the way.

## 2022-10-22 ENCOUNTER — Emergency Department
Admission: EM | Admit: 2022-10-22 | Discharge: 2022-10-22 | Discharge status: Against Medical Advice | Payer: No Typology Code available for payment source | Attending: Emergency Medicine | Admitting: Emergency Medicine

## 2022-10-22 NOTE — ED Notes (Signed)
Pt is not in her room, bathroom, or lobby.

## 2023-04-09 DIAGNOSIS — K649 Unspecified hemorrhoids: Secondary | ICD-10-CM | POA: Diagnosis not present

## 2023-04-09 DIAGNOSIS — Z1330 Encounter for screening examination for mental health and behavioral disorders, unspecified: Secondary | ICD-10-CM | POA: Diagnosis not present

## 2023-04-09 DIAGNOSIS — B070 Plantar wart: Secondary | ICD-10-CM | POA: Diagnosis not present

## 2023-04-09 DIAGNOSIS — L309 Dermatitis, unspecified: Secondary | ICD-10-CM | POA: Diagnosis not present

## 2023-04-15 DIAGNOSIS — L2089 Other atopic dermatitis: Secondary | ICD-10-CM | POA: Diagnosis not present

## 2023-04-28 DIAGNOSIS — Z1330 Encounter for screening examination for mental health and behavioral disorders, unspecified: Secondary | ICD-10-CM | POA: Diagnosis not present

## 2023-04-28 DIAGNOSIS — R3915 Urgency of urination: Secondary | ICD-10-CM | POA: Diagnosis not present

## 2023-04-28 DIAGNOSIS — D649 Anemia, unspecified: Secondary | ICD-10-CM | POA: Diagnosis not present

## 2023-04-28 DIAGNOSIS — Z13220 Encounter for screening for lipoid disorders: Secondary | ICD-10-CM | POA: Diagnosis not present

## 2023-04-28 DIAGNOSIS — Z124 Encounter for screening for malignant neoplasm of cervix: Secondary | ICD-10-CM | POA: Diagnosis not present

## 2023-04-28 DIAGNOSIS — Z0000 Encounter for general adult medical examination without abnormal findings: Secondary | ICD-10-CM | POA: Diagnosis not present

## 2023-04-28 DIAGNOSIS — F331 Major depressive disorder, recurrent, moderate: Secondary | ICD-10-CM | POA: Diagnosis not present

## 2023-12-10 DIAGNOSIS — B351 Tinea unguium: Secondary | ICD-10-CM | POA: Diagnosis not present

## 2023-12-10 DIAGNOSIS — L851 Acquired keratosis [keratoderma] palmaris et plantaris: Secondary | ICD-10-CM | POA: Diagnosis not present

## 2023-12-10 DIAGNOSIS — M2011 Hallux valgus (acquired), right foot: Secondary | ICD-10-CM | POA: Diagnosis not present

## 2023-12-10 DIAGNOSIS — R2242 Localized swelling, mass and lump, left lower limb: Secondary | ICD-10-CM | POA: Diagnosis not present

## 2023-12-10 DIAGNOSIS — M79672 Pain in left foot: Secondary | ICD-10-CM | POA: Diagnosis not present

## 2023-12-15 ENCOUNTER — Ambulatory Visit: Payer: BC Managed Care – PPO | Admitting: Internal Medicine

## 2023-12-15 DIAGNOSIS — M79672 Pain in left foot: Secondary | ICD-10-CM | POA: Diagnosis not present

## 2023-12-15 DIAGNOSIS — R2242 Localized swelling, mass and lump, left lower limb: Secondary | ICD-10-CM | POA: Diagnosis not present

## 2023-12-15 DIAGNOSIS — L851 Acquired keratosis [keratoderma] palmaris et plantaris: Secondary | ICD-10-CM | POA: Diagnosis not present

## 2023-12-23 ENCOUNTER — Ambulatory Visit: Payer: BC Managed Care – PPO | Admitting: Family Medicine

## 2024-01-31 ENCOUNTER — Ambulatory Visit: Payer: BC Managed Care – PPO | Admitting: Family Medicine

## 2024-01-31 ENCOUNTER — Encounter: Payer: Self-pay | Admitting: Family Medicine

## 2024-01-31 VITALS — BP 138/80 | HR 73 | Temp 98.1°F | Ht 64.0 in | Wt 302.0 lb

## 2024-01-31 DIAGNOSIS — E66813 Obesity, class 3: Secondary | ICD-10-CM

## 2024-01-31 DIAGNOSIS — Z862 Personal history of diseases of the blood and blood-forming organs and certain disorders involving the immune mechanism: Secondary | ICD-10-CM | POA: Diagnosis not present

## 2024-01-31 DIAGNOSIS — Z6841 Body Mass Index (BMI) 40.0 and over, adult: Secondary | ICD-10-CM | POA: Diagnosis not present

## 2024-01-31 DIAGNOSIS — Z1159 Encounter for screening for other viral diseases: Secondary | ICD-10-CM

## 2024-01-31 DIAGNOSIS — J4520 Mild intermittent asthma, uncomplicated: Secondary | ICD-10-CM

## 2024-01-31 DIAGNOSIS — Z7689 Persons encountering health services in other specified circumstances: Secondary | ICD-10-CM

## 2024-01-31 DIAGNOSIS — G5762 Lesion of plantar nerve, left lower limb: Secondary | ICD-10-CM | POA: Diagnosis not present

## 2024-01-31 LAB — COMPREHENSIVE METABOLIC PANEL WITH GFR
ALT: 17 U/L (ref 0–35)
AST: 15 U/L (ref 0–37)
Albumin: 4.3 g/dL (ref 3.5–5.2)
Alkaline Phosphatase: 100 U/L (ref 39–117)
BUN: 13 mg/dL (ref 6–23)
CO2: 26 meq/L (ref 19–32)
Calcium: 9.4 mg/dL (ref 8.4–10.5)
Chloride: 106 meq/L (ref 96–112)
Creatinine, Ser: 0.67 mg/dL (ref 0.40–1.20)
GFR: 117.67 mL/min (ref >60.00)
Glucose, Bld: 86 mg/dL (ref 70–99)
Potassium: 4.1 meq/L (ref 3.5–5.1)
Sodium: 141 meq/L (ref 135–145)
Total Bilirubin: 0.3 mg/dL (ref 0.2–1.2)
Total Protein: 8 g/dL (ref 6.0–8.3)

## 2024-01-31 LAB — CBC WITH DIFFERENTIAL/PLATELET
Basophils Absolute: 0 10*3/uL (ref 0.0–0.1)
Basophils Relative: 0.3 % (ref 0.0–3.0)
Eosinophils Absolute: 0.1 10*3/uL (ref 0.0–0.7)
Eosinophils Relative: 0.9 % (ref 0.0–5.0)
HCT: 43.7 % (ref 36.0–46.0)
Hemoglobin: 13.9 g/dL (ref 12.0–15.0)
Lymphocytes Relative: 12.4 % (ref 12.0–46.0)
Lymphs Abs: 1.5 10*3/uL (ref 0.7–4.0)
MCHC: 31.9 g/dL (ref 30.0–36.0)
MCV: 84.3 fl (ref 78.0–100.0)
Monocytes Absolute: 0.6 10*3/uL (ref 0.1–1.0)
Monocytes Relative: 4.7 % (ref 3.0–12.0)
Neutro Abs: 9.9 10*3/uL — ABNORMAL HIGH (ref 1.4–7.7)
Neutrophils Relative %: 81.7 % — ABNORMAL HIGH (ref 43.0–77.0)
Platelets: 186 10*3/uL (ref 150.0–400.0)
RBC: 5.18 Mil/uL — ABNORMAL HIGH (ref 3.87–5.11)
RDW: 21.7 % — ABNORMAL HIGH (ref 11.5–15.5)
WBC: 12.2 10*3/uL — ABNORMAL HIGH (ref 4.0–10.5)

## 2024-01-31 LAB — HEMOGLOBIN A1C: Hgb A1c MFr Bld: 5.5 % (ref 4.6–6.5)

## 2024-01-31 LAB — LIPID PANEL
Cholesterol: 234 mg/dL — ABNORMAL HIGH (ref 0–200)
HDL: 47.4 mg/dL (ref >39.00)
LDL Cholesterol: 152 mg/dL — ABNORMAL HIGH (ref 0–99)
NonHDL: 186.18
Total CHOL/HDL Ratio: 5
Triglycerides: 173 mg/dL — ABNORMAL HIGH (ref 0.0–149.0)
VLDL: 34.6 mg/dL (ref 0.0–40.0)

## 2024-01-31 LAB — TSH: TSH: 0.85 u[IU]/mL (ref 0.35–5.50)

## 2024-01-31 MED ORDER — ALBUTEROL SULFATE HFA 108 (90 BASE) MCG/ACT IN AERS: 2.0000 | INHALATION_SPRAY | Freq: Four times a day (QID) | RESPIRATORY_TRACT | 2 refills | Status: AC | PRN

## 2024-01-31 NOTE — Patient Instructions (Addendum)
-  It was nice to meet you and look forward to taking care of you. -Ordered labs. Office will call with lab results and you will see them on MyChart.  -Please contact your insurance company to find out about weight loss medication coverage and please send a message through MyChart.  -Prescribed Albuterol Inhaler as needed for asthma.  -Follow up in 1 year for a physical.

## 2024-01-31 NOTE — Progress Notes (Signed)
 New Patient Office Visit  Subjective   Patient ID: Denise Beltran, female    DOB: 22-Aug-1996  Age: 28 y.o. MRN: [id number]  CC:  Chief Complaint  Patient presents with   New Patient (Initial Visit)    HPI Denise Beltran presents to establish care with new provider.  Patients previous primary care provider was [facility] with Josephina Gip, NP. Last appointment was 04/28/2023.   Specialist: [facility] with Dr. Lorane Gell   Obesity: Patient is wanting medication for weight loss. Previous been on an appetite suppressant, for about 6 months with good results. She lost a little over 50Ibs in 6 months. Also, reports she has been referred to a nutritionist previously. Patient reports she has started exercising 5 days a week, 1.5-2 hours. Cardio and weights. Diet: Increasing water intake; general diet with a mix of healthy and unhealthy meals.    Asthma: Intermittent. Patient is requesting an inhaler to use as needed for asthma symptoms. Usually spring makes her symptoms worse with seasonal allergies.   Outpatient Encounter Medications as of 01/31/2024  Medication Sig   albuterol (VENTOLIN HFA) 108 (90 Base) MCG/ACT inhaler Inhale 2 puffs into the lungs every 6 (six) hours as needed for wheezing or shortness of breath (Cough).   APPLE CIDER VINEGAR PO Take 480 mg by mouth daily at 6 (six) AM.   Cyanocobalamin (VITAMIN B-12) 5000 MCG TBDP Take 1 capsule by mouth daily at 6 (six) AM.   Ferrous Sulfate Dried 45 MG TBCR Take 1 tablet by mouth daily at 6 (six) AM.   magnesium gluconate (MAGONATE) 500 MG tablet Take 500 mg by mouth daily.   Multiple Vitamin (MULTIVITAMIN WITH MINERALS) TABS tablet Take 1 tablet by mouth daily.   Omega-3 Fatty Acids (FISH OIL) 300 MG CAPS Take 1 capsule by mouth daily at 6 (six) AM.   terbinafine (LAMISIL) 250 MG tablet Take 250 mg by mouth daily.   triamcinolone cream (KENALOG) 0.1 % Apply 1  application topically 2 (two) times daily.   [DISCONTINUED] Biotin 10 MG CAPS Take 10 mg by mouth daily.   [DISCONTINUED] diphenhydrAMINE (BENADRYL) 25 MG tablet Take 50 mg by mouth every 6 (six) hours as needed for itching.   [DISCONTINUED] Emollient (CETAPHIL) cream Apply topically as needed.   [DISCONTINUED] ibuprofen (ADVIL,MOTRIN) 200 MG tablet Take 600 mg by mouth every 6 (six) hours as needed.   [DISCONTINUED] Aspirin-Salicylamide-Caffeine (BC FAST PAIN RELIEF) 650-195-33.3 MG PACK Take 1 application by mouth daily as needed.   No facility-administered encounter medications on file as of 01/31/2024.    Past Medical History:  Diagnosis Date   Anemia    Asthma    Eczema    Obesity     History reviewed. No pertinent surgical history.  Family History  Problem Relation Age of Onset   Diabetes Mother    Hypertension Father    Stroke Father    Alcohol abuse Paternal Grandfather     Social History   Socioeconomic History   Marital status: Significant Other    Spouse name: Not on file   Number of children: 0   Years of education: Not on file   Highest education level: Bachelor's degree (e.g., BA, AB, BS)  Occupational History   Not on file  Tobacco Use   Smoking status: Light Smoker    Types: Cigarettes   Smokeless tobacco: Never  Vaping Use   Vaping status: Some Days  Substance and Sexual  Activity   Alcohol use: No   Drug use: No   Sexual activity: Yes    Birth control/protection: None  Other Topics Concern   Not on file  Social History Narrative   Not on file   Social Drivers of Health   Financial Resource Strain: Low Risk  (01/27/2024)   Overall Financial Resource Strain (CARDIA)    Difficulty of Paying Living Expenses: Not very hard  Food Insecurity: Food Insecurity Present (01/27/2024)   Hunger Vital Sign    Worried About Running Out of Food in the Last Year: Sometimes true    Ran Out of Food in the Last Year: Sometimes true  Transportation Needs: No  Transportation Needs (01/27/2024)   PRAPARE - Administrator, Civil Service (Medical): No    Lack of Transportation (Non-Medical): No  Physical Activity: Sufficiently Active (01/27/2024)   Exercise Vital Sign    Days of Exercise per Week: 5 days    Minutes of Exercise per Session: 90 min  Stress: Stress Concern Present (01/27/2024)   [employer] of Occupational Health - Occupational Stress Questionnaire    Feeling of Stress : Rather much  Social Connections: Socially Isolated (01/27/2024)   Social Connection and Isolation Panel [NHANES]    Frequency of Communication with Friends and Family: Once a week    Frequency of Social Gatherings with Friends and Family: Once a week    Attends Religious Services: Never    Database administrator or Organizations: No    Attends Engineer, structural: Not on file    Marital Status: Never married  Intimate Partner Violence: Not At Risk (04/25/2023)   Received from [facility]   HITS    Over the last 12 months how often did your partner physically hurt you?: Never    Over the last 12 months how often did your partner insult you or talk down to you?: Never    Over the last 12 months how often did your partner threaten you with physical harm?: Never    Over the last 12 months how often did your partner scream or curse at you?: Never    ROS See HPI above    Objective  BP 138/80   Pulse 73   Temp 98.1 F (36.7 C) (Oral)   Ht 5\' 4"  (1.626 m)   Wt (!) 302 lb (137 kg)   SpO2 95%   BMI 51.84 kg/m   Physical Exam Vitals reviewed.  Constitutional:      General: She is not in acute distress.    Appearance: Normal appearance. She is obese. She is not ill-appearing, toxic-appearing or diaphoretic.  HENT:     Head: Normocephalic and atraumatic.  Eyes:     General:        Right eye: No discharge.        Left eye: No discharge.     Conjunctiva/sclera: Conjunctivae normal.  Cardiovascular:     Rate and Rhythm: Normal rate  and regular rhythm.     Heart sounds: Normal heart sounds. No murmur heard.    No friction rub. No gallop.  Pulmonary:     Effort: Pulmonary effort is normal. No respiratory distress.     Breath sounds: Normal breath sounds.  Musculoskeletal:        General: Normal range of motion.  Skin:    General: Skin is warm and dry.  Neurological:     General: No focal deficit present.     Mental Status:  She is alert and oriented to person, place, and time. Mental status is at baseline.  Psychiatric:        Mood and Affect: Mood normal.        Behavior: Behavior normal.        Thought Content: Thought content normal.        Judgment: Judgment normal.      Assessment & Plan:  Class 3 severe obesity due to excess calories without serious comorbidity with body mass index (BMI) of 50.0 to 59.9 in adult (HCC) -     CBC with Differential/Platelet -     Comprehensive metabolic panel with GFR -     Iron, TIBC and Ferritin Panel -     Hemoglobin A1c -     Lipid panel -     TSH  Need for hepatitis C screening test -     Hepatitis C antibody  History of anemia -     CBC with Differential/Platelet -     Iron, TIBC and Ferritin Panel  Mild intermittent asthma without complication -     Albuterol Sulfate HFA; Inhale 2 puffs into the lungs every 6 (six) hours as needed for wheezing or shortness of breath (Cough).  Dispense: 8 g; Refill: 2   1.Review health maintenance:  -Covid booster: Declines  -Influenza vaccine: Declines  -Hep C screening: Ordered  -PNA: Declines  2.Ordered labs (CBC, CMP, Iron panel, Hep C, TSH, lipid panel, A1c) based on screening, history of anemia, and BMI-obesity.   3.Please contact insurance company to find out about weight loss medication coverage and please send a message through MyChart.  4.Prescribed Albuterol Inhaler as needed for asthma.  Return in about 1 year (around 01/30/2025) for physical.   Zandra Abts, NP

## 2024-02-01 ENCOUNTER — Encounter: Payer: Self-pay | Admitting: Family Medicine

## 2024-02-01 DIAGNOSIS — D72829 Elevated white blood cell count, unspecified: Secondary | ICD-10-CM

## 2024-02-01 DIAGNOSIS — D649 Anemia, unspecified: Secondary | ICD-10-CM

## 2024-02-01 LAB — HEPATITIS C ANTIBODY: Hepatitis C Ab: NONREACTIVE

## 2024-02-01 LAB — IRON,TIBC AND FERRITIN PANEL
%SAT: 15 % — ABNORMAL LOW (ref 16–45)
Ferritin: 8 ng/mL — ABNORMAL LOW (ref 16–154)
Iron: 60 ug/dL (ref 40–190)
TIBC: 396 ug/dL (ref 250–450)

## 2024-02-04 ENCOUNTER — Encounter: Payer: Self-pay | Admitting: Family Medicine

## 2024-02-04 ENCOUNTER — Telehealth: Admitting: Family Medicine

## 2024-02-04 VITALS — BP 125/82 | HR 96 | Ht 64.0 in | Wt 302.0 lb

## 2024-02-04 DIAGNOSIS — E66813 Obesity, class 3: Secondary | ICD-10-CM | POA: Diagnosis not present

## 2024-02-04 DIAGNOSIS — Z6841 Body Mass Index (BMI) 40.0 and over, adult: Secondary | ICD-10-CM | POA: Diagnosis not present

## 2024-02-04 MED ORDER — ZEPBOUND 2.5 MG/0.5ML ~~LOC~~ SOAJ: 2.5000 mg | SUBCUTANEOUS | 0 refills | Status: DC

## 2024-02-04 NOTE — Progress Notes (Signed)
 Virtual Visit via Video Note  I connected with Denise Beltran on 02/04/24 at 4:15 PM by a video enabled telemedicine application and verified that I am speaking with the correct person using two identifiers.   Patient Location: Home Provider Location: office - [place].    I discussed the limitations, risks, security and privacy concerns of performing an evaluation and management service by telephone and the availability of in person appointments. I also discussed with the patient that there may be a patient responsible charge related to this service. The patient expressed understanding and agreed to proceed, consent obtained  Chief Complaint  Patient presents with   Weight Loss    History of Present Illness: Denise Beltran is a 28 y.o. female that is following up on obesity and wanting to lose weight. Patient has got in contact with her insurance company and are willing to cover Phentermine, Zepbound, Wegovy, Ozempic, and Contrave.   At her established care visit on 03/31, she reported is excercising 5 days a week, 1.5-2 hours of cardio and weight training. Diet: Increasing water intake, general diet with a mix of healthy and unhealthy meals.    There are no active problems to display for this patient.  Past Medical History:  Diagnosis Date   Anemia    Asthma    Eczema    Obesity    No past surgical history on file. No Known Allergies Prior to Admission medications   Medication Sig Start Date End Date Taking? Authorizing Provider  albuterol (VENTOLIN HFA) 108 (90 Base) MCG/ACT inhaler Inhale 2 puffs into the lungs every 6 (six) hours as needed for wheezing or shortness of breath (Cough). 01/31/24   Alveria Apley, NP  APPLE CIDER VINEGAR PO Take 480 mg by mouth daily at 6 (six) AM.    [provider]  Cyanocobalamin (VITAMIN B-12) 5000 MCG TBDP Take 1 capsule by mouth daily at 6 (six) AM.    [provider]  Ferrous Sulfate Dried 45 MG TBCR Take 1 tablet  by mouth daily at 6 (six) AM.    [provider]  magnesium gluconate (MAGONATE) 500 MG tablet Take 500 mg by mouth daily.    [provider]  Multiple Vitamin (MULTIVITAMIN WITH MINERALS) TABS tablet Take 1 tablet by mouth daily.    [provider]  Omega-3 Fatty Acids (FISH OIL) 300 MG CAPS Take 1 capsule by mouth daily at 6 (six) AM.    [provider]  terbinafine (LAMISIL) 250 MG tablet Take 250 mg by mouth daily.    [provider]  triamcinolone cream (KENALOG) 0.1 % Apply 1 application topically 2 (two) times daily. 03/08/18   Loletta Specter, PA-C   Social History   Socioeconomic History   Marital status: Significant Other    Spouse name: Not on file   Number of children: 0   Years of education: Not on file   Highest education level: Bachelor's degree (e.g., BA, AB, BS)  Occupational History   Not on file  Tobacco Use   Smoking status: Light Smoker    Types: Cigarettes   Smokeless tobacco: Never  Vaping Use   Vaping status: Some Days  Substance and Sexual Activity   Alcohol use: No   Drug use: No   Sexual activity: Yes    Birth control/protection: None  Other Topics Concern   Not on file  Social History Narrative   Not on file   Social Drivers of Corporate investment banker  Strain: Low Risk  (01/27/2024)   Overall Financial Resource Strain (CARDIA)    Difficulty of Paying Living Expenses: Not very hard  Food Insecurity: Food Insecurity Present (01/27/2024)   Hunger Vital Sign    Worried About Running Out of Food in the Last Year: Sometimes true    Ran Out of Food in the Last Year: Sometimes true  Transportation Needs: No Transportation Needs (01/27/2024)   PRAPARE - Administrator, Civil Service (Medical): No    Lack of Transportation (Non-Medical): No  Physical Activity: Sufficiently Active (01/27/2024)   Exercise Vital Sign    Days of Exercise per Week: 5 days    Minutes of Exercise per Session: 90 min   Stress: Stress Concern Present (01/27/2024)   [employer] of Occupational Health - Occupational Stress Questionnaire    Feeling of Stress : Rather much  Social Connections: Socially Isolated (01/27/2024)   Social Connection and Isolation Panel [NHANES]    Frequency of Communication with Friends and Family: Once a week    Frequency of Social Gatherings with Friends and Family: Once a week    Attends Religious Services: Never    Database administrator or Organizations: No    Attends Engineer, structural: Not on file    Marital Status: Never married  Intimate Partner Violence: Not At Risk (04/25/2023)   Received from [facility]   HITS    Over the last 12 months how often did your partner physically hurt you?: Never    Over the last 12 months how often did your partner insult you or talk down to you?: Never    Over the last 12 months how often did your partner threaten you with physical harm?: Never    Over the last 12 months how often did your partner scream or curse at you?: Never    Observations/Objective: Today's Vitals   02/04/24 1556 02/04/24 1557  BP:  125/82  Pulse:  96  Weight: (!) 302 lb (137 kg)   Height: 5\' 4"  (1.626 m)    Unable to obtain vital signs since equipment is not accessible.  Physical Exam Vitals reviewed.  Constitutional:      General: She is not in acute distress.    Appearance: Normal appearance. She is obese. She is not ill-appearing, toxic-appearing or diaphoretic.  Eyes:     General:        Right eye: No discharge.        Left eye: No discharge.     Conjunctiva/sclera: Conjunctivae normal.  Cardiovascular:     Rate and Rhythm: Normal rate.  Pulmonary:     Effort: Pulmonary effort is normal. No respiratory distress.  Skin:    General: Skin is dry.  Neurological:     General: No focal deficit present.     Mental Status: She is alert and oriented to person, place, and time. Mental status is at baseline.  Psychiatric:        Mood  and Affect: Mood normal.        Behavior: Behavior normal.        Thought Content: Thought content normal.        Judgment: Judgment normal.     Assessment and Plan: Class 3 severe obesity due to excess calories without serious comorbidity with body mass index (BMI) of 50.0 to 59.9 in adult (HCC) -     Zepbound; Inject 2.5 mg into the skin once a week.  Dispense: 2 mL; Refill: 0  -  Through shared decision making, decided on Zepbound. -Discussed about medication, side effects and how to help with side effects.  -Recommend to continue exercise on a regular basis and eat a healthy diet.  -Follow up in 4 weeks after starting injection. If doing great on medication, will increase dose.    I discussed the assessment and treatment plan with the patient. The patient was provided an opportunity to ask questions and all were answered. The patient agreed with the plan and demonstrated an understanding of the instructions.   The patient was advised to call back or seek an in-person evaluation if the symptoms worsen or if the condition fails to improve as anticipated.  Zandra Abts, NP

## 2024-02-04 NOTE — Patient Instructions (Addendum)
-  Through shared decision making, decided on Zepbound. -Discussed about medication, side effects and how to help with side effects.  -Recommend to continue exercise on a regular basis and eat a healthy diet.  -Follow up in 4 weeks after starting injection. If doing great on medication, will increase dose.

## 2024-02-15 ENCOUNTER — Other Ambulatory Visit (HOSPITAL_COMMUNITY): Payer: Self-pay

## 2024-02-15 ENCOUNTER — Telehealth: Payer: Self-pay

## 2024-02-15 NOTE — Telephone Encounter (Signed)
 Pharmacy Patient Advocate Encounter   Received notification from Onbase that prior authorization for Zepbound 2.5MG /0.5ML pen-injectors is required/requested.   Insurance verification completed.   The patient is insured through [employer] .   Per test claim: PA required; PA submitted to above mentioned insurance via CoverMyMeds Key/confirmation #/EOC [id number] Status is pending

## 2024-02-15 NOTE — Telephone Encounter (Signed)
 Pharmacy Patient Advocate Encounter  Received notification from [facility] that Prior Authorization for Zepbound 2.5MG /0.5ML pen-injectors has been APPROVED from 01/16/24 to 10/12/24. Ran test claim, Copay is $24.99. This test claim was processed through [facility]- copay amounts may vary at other pharmacies due to pharmacy/plan contracts, or as the patient moves through the different stages of their insurance plan.   PA #/Case ID/Reference #: [id number]

## 2024-02-23 ENCOUNTER — Telehealth: Payer: Self-pay

## 2024-02-23 NOTE — Telephone Encounter (Signed)
 Copied from CRM [id number]. Topic: Referral - Status >> Feb 23, 2024 12:19 PM [place] wrote: Reason for CRM: Referral for Anemia is being closed due to not being abel to contact the patient- [facility]. [phone] [place]

## 2024-03-13 ENCOUNTER — Telehealth: Payer: Self-pay | Admitting: Family Medicine

## 2024-03-13 ENCOUNTER — Ambulatory Visit: Admitting: Family Medicine

## 2024-03-13 DIAGNOSIS — M7752 Other enthesopathy of left foot: Secondary | ICD-10-CM | POA: Diagnosis not present

## 2024-03-13 DIAGNOSIS — L851 Acquired keratosis [keratoderma] palmaris et plantaris: Secondary | ICD-10-CM | POA: Diagnosis not present

## 2024-03-13 DIAGNOSIS — M79672 Pain in left foot: Secondary | ICD-10-CM | POA: Diagnosis not present

## 2024-03-13 DIAGNOSIS — R2242 Localized swelling, mass and lump, left lower limb: Secondary | ICD-10-CM | POA: Diagnosis not present

## 2024-03-16 ENCOUNTER — Encounter: Payer: Self-pay | Admitting: Family Medicine

## 2024-03-16 ENCOUNTER — Ambulatory Visit: Admitting: Family Medicine

## 2024-03-16 VITALS — BP 118/70 | HR 80 | Temp 98.5°F | Ht 64.0 in | Wt 303.0 lb

## 2024-03-16 DIAGNOSIS — Z6841 Body Mass Index (BMI) 40.0 and over, adult: Secondary | ICD-10-CM

## 2024-03-16 DIAGNOSIS — E66813 Obesity, class 3: Secondary | ICD-10-CM

## 2024-03-16 DIAGNOSIS — L309 Dermatitis, unspecified: Secondary | ICD-10-CM | POA: Diagnosis not present

## 2024-03-16 MED ORDER — TRIAMCINOLONE ACETONIDE 0.1 % EX CREA: 1.0000 | TOPICAL_CREAM | Freq: Two times a day (BID) | CUTANEOUS | 0 refills | Status: AC

## 2024-03-16 MED ORDER — TIRZEPATIDE-WEIGHT MANAGEMENT 5 MG/0.5ML ~~LOC~~ SOLN: 5.0000 mg | SUBCUTANEOUS | 0 refills | Status: DC

## 2024-03-16 NOTE — Progress Notes (Signed)
 Established Patient Office Visit  Subjective   Patient ID: Denise Beltran, female    DOB: [birth year]  Age: 28 y.o. MRN: [id number]  Chief Complaint  Patient presents with   Medication Management    Zepbound .follow up      Denise Beltran 28 year old presents today for a medication follow-up.   Weight loss-Patient is currently taking prescribed zepbound . Patient takes her zepbound  every Tuesday.Patient is out of medication so she missed this Tuesday's dose. Patient states that she is drinking 64oz of water daily and walking 45 minutes twice a week. Patient states that she is eating take out, and carbohydrates mostly. Eating 2 meals a day. Hungry at night dont need to eat as much portions smaller. Bowel movements have decreased. Patient states that she is using the bathroom every day and she has not had to take stool softener. Patient states that she does get nauseous one time a day but if she drinks water or uses the bathroom the nausea subsides. Patient denies acid reflux, vomiting, abdominal pain or constipation.  Eczema- Patient is currently taking prescribed triamcinolone . Patient requests a refill on triamcinolone  cream for eczema.     Denise Beltran 28 year old female presents today for a medication follow-up.      Wt Readings from Last 3 Encounters:  03/16/24 (!) 303 lb (137.4 kg)  02/04/24 (!) 302 lb (137 kg)  01/31/24 (!) 302 lb (137 kg)     Review of Systems  Gastrointestinal:  Positive for nausea. Negative for abdominal pain, constipation, diarrhea, heartburn and vomiting.  Neurological:  Negative for loss of consciousness.      Objective:    Wt Readings from Last 3 Encounters:  03/16/24 (!) 303 lb (137.4 kg)  02/04/24 (!) 302 lb (137 kg)  01/31/24 (!) 302 lb (137 kg)   Temp Readings from Last 3 Encounters:  03/16/24 98.5 F (36.9 C) (Oral)  01/31/24 98.1 F (36.7 C) (Oral)  10/21/22 98.4 F (36.9 C) (Oral)   BP Readings from Last 3 Encounters:  03/16/24  118/70  02/04/24 125/82  01/31/24 138/80   Pulse Readings from Last 3 Encounters:  03/16/24 80  02/04/24 96  01/31/24 73     Physical Exam Constitutional:      Appearance: Normal appearance.  Eyes:     Extraocular Movements: Extraocular movements intact.  Cardiovascular:     Rate and Rhythm: Normal rate and regular rhythm.  Pulmonary:     Effort: Pulmonary effort is normal.     Breath sounds: Normal breath sounds.  Abdominal:     General: Abdomen is flat. Bowel sounds are normal.     Palpations: Abdomen is soft.  Musculoskeletal:     Cervical back: Normal range of motion.  Skin:    General: Skin is warm.  Neurological:     Mental Status: She is alert.       Assessment & Plan:   Increased BMI - Zepbound  5mg  RX sent to pharmacy - Start taking Zepboud 5mg  for increased BMI and weightloss.  - Patient educated to call or message in MyChart if nausea worsens.  -Educated on healthy eating and changing habits one thing at a time. Eating 6 small meals a day is encouraged.  -Educated on increasing protein in diet to prevent muscle wasting and hair loss.  - 60-100 oz of water is encouraged.  -150 minutes of exercise a week is encouraged.   Eczema -Triamcinolone  RX sent to the pharmacy for eczema. Apply daily to affected  areas.  Next appointment schedule 04/11/24. Please make an appointment sooner if problems arise.     Janeann Mean, RN

## 2024-03-16 NOTE — Patient Instructions (Signed)
 It was a pleasure seeing you today!  - Medication follow-up visit completed today. - Zepbound  5mg  prescription sent to pharmacy. - Start taking Zepboud 5mg  for increased weight loss.  - Please call or message in MyChart if nausea worsens.  -Triamcinolone  RX sent to the pharmacy for eczema. Apply daily to affected areas.

## 2024-03-17 ENCOUNTER — Telehealth: Payer: Self-pay

## 2024-03-17 NOTE — Telephone Encounter (Signed)

## 2024-03-20 NOTE — Telephone Encounter (Signed)
 Okay thank you! Not sure why [facility] started prior auth process, but we'll cancel pa.

## 2024-03-23 ENCOUNTER — Telehealth: Payer: Self-pay | Admitting: *Deleted

## 2024-03-23 ENCOUNTER — Encounter: Payer: Self-pay | Admitting: Family Medicine

## 2024-03-23 ENCOUNTER — Other Ambulatory Visit: Payer: Self-pay

## 2024-03-23 DIAGNOSIS — Z6841 Body Mass Index (BMI) 40.0 and over, adult: Secondary | ICD-10-CM

## 2024-03-23 MED ORDER — TIRZEPATIDE-WEIGHT MANAGEMENT 5 MG/0.5ML ~~LOC~~ SOLN: 5.0000 mg | SUBCUTANEOUS | 0 refills | Status: AC

## 2024-03-23 NOTE — Telephone Encounter (Signed)
 Copied from CRM [id number]. Topic: Clinical - Medication Prior Auth >> Mar 23, 2024  4:28 PM [place] J wrote: Reason for CRM: Patient is needing a prior authorization for tirzepatide  5 MG/0.5ML. For some reason, the pharmacy at [facility] tried submitting an authorization request for mounjaro.  Patient will be 2 weeks behind and wanted to make sure her PCP is aware.

## 2024-03-24 ENCOUNTER — Other Ambulatory Visit (HOSPITAL_COMMUNITY): Payer: Self-pay

## 2024-03-24 ENCOUNTER — Telehealth: Payer: Self-pay

## 2024-03-24 ENCOUNTER — Other Ambulatory Visit: Payer: Self-pay

## 2024-03-24 NOTE — Telephone Encounter (Signed)
 Pharmacy Patient Advocate Encounter  Insurance verification completed.   The patient is insured through [employer] test claim for Zepbound  5. Currently a quantity of 2 is a 28 day supply and the co-pay is 24.99 . The current 28 day co-pay is, $24.99.  No PA needed at this time.  This test claim was processed through [facility]- copay amounts may vary at other pharmacies due to pharmacy/plan contracts, or as the patient moves through the different stages of their insurance plan.

## 2024-03-24 NOTE — Telephone Encounter (Signed)
 Pharmacy Patient Advocate Encounter   Received notification from Physician's Office that prior authorization for Zepbound  5 is required/requested.   Insurance verification completed.   The patient is insured through [employer] .   Per test claim: The current 28 day co-pay is, $24.99.  No PA needed at this time. This test claim was processed through [facility]- copay amounts may vary at other pharmacies due to pharmacy/plan contracts, or as the patient moves through the different stages of their insurance plan.

## 2024-03-29 ENCOUNTER — Encounter: Admitting: Family Medicine

## 2024-03-31 ENCOUNTER — Encounter: Payer: Self-pay | Admitting: Family Medicine

## 2024-03-31 ENCOUNTER — Telehealth: Payer: Self-pay

## 2024-03-31 ENCOUNTER — Other Ambulatory Visit (HOSPITAL_COMMUNITY): Payer: Self-pay

## 2024-03-31 NOTE — Telephone Encounter (Signed)
 Pharmacy Patient Advocate Encounter  Insurance verification completed.   The patient is insured through [employer] test claim for Zepbound  5. Currently a quantity of 2 is a 28 day supply and the co-pay is $24.99 . The current 28 day co-pay is, $$24.99.  No PA needed at this time.  This test claim was processed through [facility]- copay amounts may vary at other pharmacies due to pharmacy/plan contracts, or as the patient moves through the different stages of their insurance plan.

## 2024-03-31 NOTE — Telephone Encounter (Signed)
 Sent pa a message about medication.

## 2024-04-11 ENCOUNTER — Encounter: Admitting: Family Medicine

## 2024-04-12 ENCOUNTER — Telehealth: Payer: Self-pay

## 2024-04-12 DIAGNOSIS — E66813 Obesity, class 3: Secondary | ICD-10-CM

## 2024-04-12 NOTE — Telephone Encounter (Signed)
 Copied from CRM [id number]. Topic: Clinical - Medication Prior Auth >> Apr 12, 2024  3:47 PM Jim Motts C wrote: Reason for CRM: Patient called in and stated that the script for her Munjaro is on hold due to a prior authorization needed. She stated the pharmacy did not receive a response from the clinic just yet. Patient wanted to make aware and follow up. Patient contact is [phone].

## 2024-05-02 MED ORDER — TIRZEPATIDE-WEIGHT MANAGEMENT 7.5 MG/0.5ML ~~LOC~~ SOLN: 7.5000 mg | SUBCUTANEOUS | 1 refills | Status: DC

## 2024-05-02 NOTE — Addendum Note (Signed)
 Addended by: ELNER NANNY B on: 05/02/2024 09:38 AM   Modules accepted: Orders

## 2024-05-08 ENCOUNTER — Other Ambulatory Visit (HOSPITAL_COMMUNITY): Payer: Self-pay

## 2024-05-26 ENCOUNTER — Encounter: Admitting: Family Medicine

## 2024-05-30 ENCOUNTER — Ambulatory Visit (INDEPENDENT_AMBULATORY_CARE_PROVIDER_SITE_OTHER): Admitting: Family Medicine

## 2024-05-30 ENCOUNTER — Encounter: Payer: Self-pay | Admitting: Family Medicine

## 2024-05-30 VITALS — BP 120/74 | HR 85 | Temp 98.4°F | Ht 64.0 in | Wt 292.8 lb

## 2024-05-30 DIAGNOSIS — D649 Anemia, unspecified: Secondary | ICD-10-CM | POA: Diagnosis not present

## 2024-05-30 DIAGNOSIS — Z0000 Encounter for general adult medical examination without abnormal findings: Secondary | ICD-10-CM | POA: Diagnosis not present

## 2024-05-30 DIAGNOSIS — Z23 Encounter for immunization: Secondary | ICD-10-CM

## 2024-05-30 DIAGNOSIS — Z6841 Body Mass Index (BMI) 40.0 and over, adult: Secondary | ICD-10-CM

## 2024-05-30 DIAGNOSIS — E66813 Obesity, class 3: Secondary | ICD-10-CM | POA: Diagnosis not present

## 2024-05-30 DIAGNOSIS — F419 Anxiety disorder, unspecified: Secondary | ICD-10-CM

## 2024-05-30 LAB — IRON,TIBC AND FERRITIN PANEL
%SAT: 6 % — ABNORMAL LOW (ref 16–45)
Ferritin: 4 ng/mL — ABNORMAL LOW (ref 16–154)
Iron: 24 ug/dL — ABNORMAL LOW (ref 40–190)
TIBC: 379 ug/dL (ref 250–450)

## 2024-05-30 MED ORDER — BUPROPION HCL ER (XL) 150 MG PO TB24: 150.0000 mg | ORAL_TABLET | Freq: Every day | ORAL | 0 refills | Status: DC

## 2024-05-30 NOTE — Patient Instructions (Addendum)
 It was a pleasure seeing you today!   - Continue medication regimen.  - Continue eating a well balanced diet with fruits and vegetables.  - Continue drinking 64-100oz of water daily - Continue going to the gym. 150 minutes of exercise is recommended each week.  - Pneumococcal vaccine administered at today's visit.  - Wellbutrin  XR 150mg  prescription sent to preferred pharmacy. Education provided on possible side effects. If Suicidal Ideations or Homicidal Ideations occur discontinue medication and go to nearest ER.  -Ordered labs. Office will call with results and will be available on MyChart.   Return in about 4 weeks (around 06/27/2024) for telehealth or in person .

## 2024-05-30 NOTE — Progress Notes (Signed)
 Complete physical exam  Patient: Denise Beltran   DOB: [birth year]   28 y.o. Female  MRN: [id number]  Subjective:    Chief Complaint  Patient presents with   Annual Exam    Danely Gage is a 28 y.o. female who presents today for a complete physical exam. She reports consuming a general diet. Gym/ health club routine includes cardio and light weights. She generally feels well. She reports sleeping well. She does have additional problems to discuss today.   Anxiety- Patient would like to have a discussion about anxiety today. She states that she is having generalized anxiety. No certain situations bring on anxiety. She reports that she feels like something bad is going to happen. She picks at her cuticles. She has occasional anxiety attacks. Her last anxiety attack was a month and a half ago. Denies SI or HI  Also, concerned that she may have ADHD. She reports she often has trouble completing task and easily distracted. She reports as a child she had trouble concentrating.   Most recent fall risk assessment:    05/30/2024    3:53 PM  Fall Risk   Falls in the past year? 0  Number falls in past yr: 0  Injury with Fall? 0  Risk for fall due to : No Fall Risks  Follow up Falls evaluation completed     Most recent depression screenings:    05/30/2024    3:53 PM 01/31/2024    8:08 AM  PHQ 2/9 Scores  PHQ - 2 Score 0 3  PHQ- 9 Score 0 11    Vision:Within last year and Dental: No current dental problems and Receives regular dental care  Outpatient Medications Prior to Visit  Medication Sig   albuterol  (VENTOLIN  HFA) 108 (90 Base) MCG/ACT inhaler Inhale 2 puffs into the lungs every 6 (six) hours as needed for wheezing or shortness of breath (Cough).   APPLE CIDER VINEGAR PO Take 480 mg by mouth daily at 6 (six) AM.   Cyanocobalamin (VITAMIN B-12) 5000 MCG TBDP Take 1 capsule by mouth daily at 6 (six) AM.   Ferrous Sulfate Dried 45 MG TBCR Take 1 tablet by mouth daily at 6 (six) AM.    magnesium gluconate (MAGONATE) 500 MG tablet Take 500 mg by mouth daily.   Multiple Vitamin (MULTIVITAMIN WITH MINERALS) TABS tablet Take 1 tablet by mouth daily.   Omega-3 Fatty Acids (FISH OIL) 300 MG CAPS Take 1 capsule by mouth daily at 6 (six) AM.   tirzepatide  7.5 MG/0.5ML injection vial Inject 7.5 mg into the skin once a week.   triamcinolone  cream (KENALOG ) 0.1 % Apply 1 Application topically 2 (two) times daily.   terbinafine (LAMISIL) 250 MG tablet Take 250 mg by mouth daily.   No facility-administered medications prior to visit.    Review of Systems  Respiratory:  Negative for shortness of breath.   Cardiovascular:  Negative for chest pain.  Gastrointestinal:  Negative for abdominal pain, constipation, diarrhea, heartburn, nausea and vomiting.  Neurological:  Negative for dizziness and headaches.   Objective:    BP 120/74 (BP Location: Left Arm, Patient Position: Sitting, Cuff Size: Large)   Pulse 85   Temp 98.4 F (36.9 C) (Oral)   Ht 5' 4 (1.626 m)   Wt 292 lb 12.8 oz (132.8 kg)   LMP 05/14/2024 (Exact Date)   SpO2 98%   BMI 50.26 kg/m  Wt Readings from Last 3 Encounters:  05/30/24 292 lb 12.8 oz (132.8 kg)  03/16/24 (!) 303 lb (137.4 kg)  02/04/24 (!) 302 lb (137 kg)      Physical Exam Constitutional:      Appearance: Normal appearance. She is obese.  HENT:     Head: Normocephalic and atraumatic.     Right Ear: Tympanic membrane, ear canal and external ear normal.     Left Ear: Tympanic membrane, ear canal and external ear normal.     Nose: Nose normal.     Mouth/Throat:     Mouth: Mucous membranes are moist.     Pharynx: Oropharynx is clear.  Eyes:     Extraocular Movements: Extraocular movements intact.     Conjunctiva/sclera: Conjunctivae normal.  Cardiovascular:     Rate and Rhythm: Normal rate and regular rhythm.     Pulses: Normal pulses.     Heart sounds: Normal heart sounds.  Pulmonary:     Effort: Pulmonary effort is normal.   Abdominal:     General: Abdomen is flat. Bowel sounds are normal.     Palpations: Abdomen is soft.  Musculoskeletal:        General: Normal range of motion.     Cervical back: Normal range of motion and neck supple.  Skin:    General: Skin is warm and dry.     Capillary Refill: Capillary refill takes less than 2 seconds.  Neurological:     Mental Status: She is alert and oriented to person, place, and time.  Psychiatric:        Mood and Affect: Mood normal.        Behavior: Behavior normal.        Thought Content: Thought content normal.        Judgment: Judgment normal.     Assessment & Plan:    Routine Health Maintenance and Physical Exam  Immunization History  Administered Date(s) Administered   Dtap, Unspecified 05/06/1994, 07/22/1994, 01/20/1995, 07/14/1995   HIB, Unspecified 05/06/1994, 07/22/1994, 01/20/1995, 07/14/1995   HPV Quadrivalent 03/15/2009, 11/13/2010   Hep B, Unspecified 05/06/1994, 07/22/1994, 01/20/1995   Hepatitis A, Ped/Adol-2 Dose 03/15/2009, 11/13/2010   IPV 11/13/2010   Influenza, Seasonal, Injecte, Preservative Fre 10/29/2010   MMR 07/14/1995, 03/19/1997   Meningococcal Conjugate 11/13/2010   PFIZER(Purple Top)SARS-COV-2 Vaccination 03/03/2020, 03/24/2020, 11/19/2020   PNEUMOCOCCAL CONJUGATE-20 05/30/2024   Polio, Unspecified 05/06/1994, 07/22/1994, 11/11/1994   Tdap 03/15/2009, 01/19/2019   Varicella 03/15/2009, 11/13/2010    Health Maintenance  Topic Date Due   HPV VACCINES (3 - 3-dose series) 02/05/2011   Hepatitis B Vaccines (1 of 3 - 19+ 3-dose series) Never done   COVID-19 Vaccine (4 - 2024-25 season) 06/15/2024 (Originally 07/04/2023)   INFLUENZA VACCINE  06/02/2024   Cervical Cancer Screening (HPV/Pap Cotest)  04/27/2026   DTaP/Tdap/Td (7 - Td or Tdap) 01/18/2029   Pneumococcal Vaccine 12-48 Years old  Completed   Hepatitis C Screening  Completed   HIV Screening  Completed   Meningococcal B Vaccine  Aged Out    Discussed health  benefits of physical activity, and encouraged her to engage in regular exercise appropriate for her age and condition.  Problem List Items Addressed This Visit   None Visit Diagnoses       Well adult exam    -  Primary   Relevant Orders   CBC with Differential/Platelet   Comprehensive metabolic panel with GFR   Lipid panel   Iron, TIBC and Ferritin Panel     Class 3 severe obesity due to excess calories without serious comorbidity with  body mass index (BMI) of 50.0 to 59.9 in adult       Relevant Orders   CBC with Differential/Platelet   Comprehensive metabolic panel with GFR   Lipid panel   Iron, TIBC and Ferritin Panel     Anemia, unspecified type       Relevant Orders   CBC with Differential/Platelet   Iron, TIBC and Ferritin Panel     Need for pneumococcal 20-valent conjugate vaccination       Relevant Orders   Pneumococcal conjugate vaccine 20-valent (Completed)     Anxiety       Relevant Medications   buPROPion  (WELLBUTRIN  XL) 150 MG 24 hr tablet       - Continue medication regimen.  - Continue eating a well balanced diet with fruits and vegetables.  - Continue drinking 64-100oz of water daily - Continue going to the gym. 150 minutes of exercise is recommended each week.  - Pneumococcal vaccine administered at today's visit.  - Wellbutrin  XR 150mg  prescription sent to preferred pharmacy. Patient was educated on possible side effects. If SI or HI occurs discontinue medication and go to nearest ER.  -Ordered labs: CBC, CMP, Lipid panel-fasting, and Iron panel based on BMI, history of anemia. Patient had a normal TSH and A1c back in March.  Return in about 4 weeks (around 06/27/2024) for telehealth or in person .     JoAnna Williamson, NP

## 2024-05-31 ENCOUNTER — Ambulatory Visit: Payer: Self-pay | Admitting: Family Medicine

## 2024-05-31 DIAGNOSIS — D649 Anemia, unspecified: Secondary | ICD-10-CM

## 2024-05-31 DIAGNOSIS — D72829 Elevated white blood cell count, unspecified: Secondary | ICD-10-CM

## 2024-05-31 LAB — COMPREHENSIVE METABOLIC PANEL WITH GFR
ALT: 20 U/L (ref 0–35)
AST: 16 U/L (ref 0–37)
Albumin: 4.3 g/dL (ref 3.5–5.2)
Alkaline Phosphatase: 73 U/L (ref 39–117)
BUN: 13 mg/dL (ref 6–23)
CO2: 26 meq/L (ref 19–32)
Calcium: 9.3 mg/dL (ref 8.4–10.5)
Chloride: 103 meq/L (ref 96–112)
Creatinine, Ser: 0.59 mg/dL (ref 0.40–1.20)
GFR: 121.05 mL/min (ref >60.00)
Glucose, Bld: 79 mg/dL (ref 70–99)
Potassium: 4.7 meq/L (ref 3.5–5.1)
Sodium: 138 meq/L (ref 135–145)
Total Bilirubin: 0.3 mg/dL (ref 0.2–1.2)
Total Protein: 7.3 g/dL (ref 6.0–8.3)

## 2024-05-31 LAB — LIPID PANEL
Cholesterol: 190 mg/dL (ref 0–200)
HDL: 42.3 mg/dL (ref >39.00)
LDL Cholesterol: 119 mg/dL — ABNORMAL HIGH (ref 0–99)
NonHDL: 147.3
Total CHOL/HDL Ratio: 4
Triglycerides: 141 mg/dL (ref 0.0–149.0)
VLDL: 28.2 mg/dL (ref 0.0–40.0)

## 2024-05-31 LAB — CBC WITH DIFFERENTIAL/PLATELET
Basophils Absolute: 0.1 K/uL (ref 0.0–0.1)
Basophils Relative: 0.8 % (ref 0.0–3.0)
Eosinophils Absolute: 0 K/uL (ref 0.0–0.7)
Eosinophils Relative: 0.1 % (ref 0.0–5.0)
HCT: 35.4 % — ABNORMAL LOW (ref 36.0–46.0)
Hemoglobin: 11 g/dL — ABNORMAL LOW (ref 12.0–15.0)
Lymphocytes Relative: 11.2 % — ABNORMAL LOW (ref 12.0–46.0)
Lymphs Abs: 1.4 K/uL (ref 0.7–4.0)
MCHC: 31.1 g/dL (ref 30.0–36.0)
MCV: 79.3 fl (ref 78.0–100.0)
Monocytes Absolute: 0.6 K/uL (ref 0.1–1.0)
Monocytes Relative: 4.5 % (ref 3.0–12.0)
Neutro Abs: 10.7 K/uL — ABNORMAL HIGH (ref 1.4–7.7)
Neutrophils Relative %: 83.4 % — ABNORMAL HIGH (ref 43.0–77.0)
Platelets: 234 K/uL (ref 150.0–400.0)
RBC: 4.47 Mil/uL (ref 3.87–5.11)
RDW: 19.9 % — ABNORMAL HIGH (ref 11.5–15.5)
WBC: 12.8 K/uL — ABNORMAL HIGH (ref 4.0–10.5)

## 2024-06-14 ENCOUNTER — Inpatient Hospital Stay: Attending: Hematology and Oncology | Admitting: Hematology and Oncology

## 2024-06-14 ENCOUNTER — Encounter: Payer: Self-pay | Admitting: *Deleted

## 2024-06-14 ENCOUNTER — Other Ambulatory Visit

## 2024-06-14 VITALS — BP 128/83 | HR 70 | Temp 99.6°F | Resp 18 | Ht 64.0 in | Wt 298.2 lb

## 2024-06-14 DIAGNOSIS — D72828 Other elevated white blood cell count: Secondary | ICD-10-CM | POA: Insufficient documentation

## 2024-06-14 DIAGNOSIS — D72829 Elevated white blood cell count, unspecified: Secondary | ICD-10-CM | POA: Diagnosis not present

## 2024-06-14 NOTE — Assessment & Plan Note (Signed)
 Lab review: 08/20/2017: WBC 16.6, hemoglobin 14.1, platelets 198, ANC 13.9 03/08/2018: WBC 12.1, hemoglobin 13.9, platelets 125, ANC 9.4 01/31/2024: WBC 12.2, hemoglobin 13.9, platelets 186, ANC 9.9 05/30/2024: WBC 12.8, hemoglobin 11, platelets 234, ANC 10.7  Differential diagnosis of chronic neutrophilia 1. Inflammations: The only probable inflammation is gastritis 2. infections: Unlikely in her situation 3. Medications: Patient does not take any steroids 4. Bone marrow disorders  Workup: 1. CBC with differential to evaluate the peripheral smear  2. JAK-2 mutation testing 3. CRP  I explained to the patient that elevated neutrophil count is most classically associated with underlying inflammation.  It is generally not considered to be a concern for leukemia or lymphoma. Telephone visit in 2 weeks to discuss the above results.

## 2024-06-14 NOTE — Progress Notes (Signed)
 [facility] CONSULT NOTE  Patient Care Team: Billy Philippe SAUNDERS, NP as PCP - General (Family Medicine)  CHIEF COMPLAINTS/PURPOSE OF CONSULTATION:  Leukocytosis/elevated neutrophils  HISTORY OF PRESENTING ILLNESS:  History of Present Illness Denise Beltran is a 28 year old female who presents with elevated white blood cell count. She was referred by her primary care doctor for evaluation of elevated white blood cell count.  She has had a persistently elevated white blood cell count since 2018, with levels recorded at 16.6 in 2018, 12.1 in 2019, and consistently around 12.2 to 12.8 in 2025. The elevation is primarily due to increased neutrophils. No significant increase in the count has been noted over the years.  She experiences occasional joint stiffness and aches, particularly when walking, which may suggest underlying inflammation. I reviewed her records extensively and collaborated the history with the patient.   MEDICAL HISTORY:  Past Medical History:  Diagnosis Date   Allergy    Seasonal allergies since I was young   Anemia    Anxiety    Since I can remember   Asthma    Clotting disorder (HCC) 2018   bleeding after bowel movement sometimes due to hemorrhoids   Depression    Since I can remember   Eczema    GERD (gastroesophageal reflux disease) 2014   Started in college   Hypertension 2013   Can't really remember when but it's better now   Obesity     SURGICAL HISTORY: Past Surgical History:  Procedure Laterality Date   HERNIA REPAIR  2019    SOCIAL HISTORY: Social History   Socioeconomic History   Marital status: Significant Other    Spouse name: Not on file   Number of children: 0   Years of education: Not on file   Highest education level: Bachelor's degree (e.g., BA, AB, BS)  Occupational History   Not on file  Tobacco Use   Smoking status: Light Smoker    Types: E-cigarettes   Smokeless tobacco: Never   Tobacco comments:     Occasionally I'll buy a vape (twice a year)  Vaping Use   Vaping status: Some Days  Substance and Sexual Activity   Alcohol use: No   Drug use: No   Sexual activity: Yes    Birth control/protection: None    Comment: My girlfriend and I dont see a need in birth control  Other Topics Concern   Not on file  Social History Narrative   Not on file   Social Drivers of Health   Financial Resource Strain: Low Risk  (05/29/2024)   Overall Financial Resource Strain (CARDIA)    Difficulty of Paying Living Expenses: Not hard at all  Recent Concern: Financial Resource Strain - Medium Risk (03/11/2024)   Received from [facility]   Overall Financial Resource Strain (CARDIA)    Difficulty of Paying Living Expenses: Somewhat hard  Food Insecurity: No Food Insecurity (06/14/2024)   Hunger Vital Sign    Worried About Running Out of Food in the Last Year: Never true    Ran Out of Food in the Last Year: Never true  Transportation Needs: No Transportation Needs (06/14/2024)   PRAPARE - Administrator, Civil Service (Medical): No    Lack of Transportation (Non-Medical): No  Physical Activity: Sufficiently Active (05/29/2024)   Exercise Vital Sign    Days of Exercise per Week: 3 days    Minutes of Exercise per Session: 60 min  Recent Concern: Physical Activity -  Insufficiently Active (03/11/2024)   Received from [facility]   Exercise Vital Sign    On average, how many days per week do you engage in moderate to strenuous exercise (like a brisk walk)?: 2 days    On average, how many minutes do you engage in exercise at this level?: 50 min  Stress: No Stress Concern Present (05/29/2024)   [employer] of Occupational Health - Occupational Stress Questionnaire    Feeling of Stress: Only a little  Social Connections: Socially Isolated (05/29/2024)   Social Connection and Isolation Panel    Frequency of Communication with Friends and Family: More than three times a week    Frequency of  Social Gatherings with Friends and Family: Three times a week    Attends Religious Services: Never    Active Member of Clubs or Organizations: No    Attends Banker Meetings: Not on file    Marital Status: Never married  Intimate Partner Violence: Not At Risk (06/14/2024)   Humiliation, Afraid, Rape, and Kick questionnaire    Fear of Current or Ex-Partner: No    Emotionally Abused: No    Physically Abused: No    Sexually Abused: No    FAMILY HISTORY: Family History  Problem Relation Age of Onset   Diabetes Mother    Depression Mother    Hypertension Father    Stroke Father    Depression Father    Alcohol abuse Paternal Grandfather     ALLERGIES:  has no known allergies.  MEDICATIONS:  Current Outpatient Medications  Medication Sig Dispense Refill   albuterol  (VENTOLIN  HFA) 108 (90 Base) MCG/ACT inhaler Inhale 2 puffs into the lungs every 6 (six) hours as needed for wheezing or shortness of breath (Cough). 8 g 2   APPLE CIDER VINEGAR PO Take 480 mg by mouth daily at 6 (six) AM.     buPROPion  (WELLBUTRIN  XL) 150 MG 24 hr tablet Take 1 tablet (150 mg total) by mouth daily. 30 tablet 0   Cyanocobalamin (VITAMIN B-12) 5000 MCG TBDP Take 1 capsule by mouth daily at 6 (six) AM.     Ferrous Sulfate Dried 45 MG TBCR Take 1 tablet by mouth daily at 6 (six) AM.     magnesium gluconate (MAGONATE) 500 MG tablet Take 500 mg by mouth daily.     Multiple Vitamin (MULTIVITAMIN WITH MINERALS) TABS tablet Take 1 tablet by mouth daily.     Omega-3 Fatty Acids (FISH OIL) 300 MG CAPS Take 1 capsule by mouth daily at 6 (six) AM.     tirzepatide  7.5 MG/0.5ML injection vial Inject 7.5 mg into the skin once a week. 2 mL 1   triamcinolone  cream (KENALOG ) 0.1 % Apply 1 Application topically 2 (two) times daily. 80 g 0   No current facility-administered medications for this visit.    REVIEW OF SYSTEMS:   Constitutional: Denies fevers, chills or abnormal night sweats  All other systems  were reviewed with the patient and are negative.  PHYSICAL EXAMINATION: ECOG PERFORMANCE STATUS: 1 - Symptomatic but completely ambulatory  Vitals:   06/14/24 1513  BP: 128/83  Pulse: 70  Resp: 18  Temp: 99.6 F (37.6 C)  SpO2: 95%   Filed Weights   06/14/24 1513  Weight: 298 lb 3.2 oz (135.3 kg)    GENERAL:alert, no distress and comfortable  LABORATORY DATA:  I have reviewed the data as listed Lab Results  Component Value Date   WBC 12.8 (H) 05/30/2024   HGB 11.0 (  L) 05/30/2024   HCT 35.4 (L) 05/30/2024   MCV 79.3 05/30/2024   PLT 234.0 05/30/2024   Lab Results  Component Value Date   NA 138 05/30/2024   K 4.7 05/30/2024   CL 103 05/30/2024   CO2 26 05/30/2024    RADIOGRAPHIC STUDIES: I have personally reviewed the radiological reports and agreed with the findings in the report.  ASSESSMENT AND PLAN:  Neutrophilia Lab review: 08/20/2017: WBC 16.6, hemoglobin 14.1, platelets 198, ANC 13.9 03/08/2018: WBC 12.1, hemoglobin 13.9, platelets 125, ANC 9.4 01/31/2024: WBC 12.2, hemoglobin 13.9, platelets 186, ANC 9.9 05/30/2024: WBC 12.8, hemoglobin 11, platelets 234, ANC 10.7  Differential diagnosis of chronic neutrophilia 1. Inflammations: The only probable inflammation is joint stiffness/arthritis versus obesity related versus fatty liver 2. infections: Unlikely in her situation 3. Medications: Patient does not take any steroids 4. Bone marrow disorders: Very low likelihood since the neutrophil elevation has been going on for the past 7 years  I do not recommend any additional workup at this time. Measures to decrease inflammation: I discussed with her extensively about different measures that can be taken to decrease inflammation.  I especially wanted her to look at diet and weight as significant risk factors.  Return to clinic on an as-needed basis. 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 Bone marrow disorders unlikely. Possible causes include arthritis or fatty liver. - Advise dietary modifications to reduce inflammation, including increasing plant-based foods and reducing high-fat content foods. - Recommend regular exercise to reduce inflammation. - Suggest over-the-counter turmeric 500 mg once daily and vitamin D 1000 IU daily to reduce inflammation. - Advise weight management to reduce inflammation. - No indication for bone marrow biopsy - Offer availability for follow-up on an as-needed basis.  Inflammatory joint symptoms Occasional joint stiffness and aching suggest low-grade joint inflammation, possibly contributing to mild neutrophilia. - Advise dietary modifications, exercise, and supplements as outlined for neutrophilia to address joint inflammation.     All questions were answered. The patient knows to call the clinic with any problems, questions or concerns.    Viinay K Kewana Sanon, MD 06/14/24

## 2024-06-17 ENCOUNTER — Other Ambulatory Visit: Payer: Self-pay | Admitting: Family Medicine

## 2024-06-17 DIAGNOSIS — E66813 Obesity, class 3: Secondary | ICD-10-CM

## 2024-06-19 MED ORDER — ZEPBOUND 10 MG/0.5ML ~~LOC~~ SOAJ: 10.0000 mg | SUBCUTANEOUS | 0 refills | Status: DC

## 2024-06-27 ENCOUNTER — Encounter: Payer: Self-pay | Admitting: Family Medicine

## 2024-06-27 ENCOUNTER — Telehealth: Admitting: Family Medicine

## 2024-06-27 VITALS — Ht 64.0 in | Wt 292.0 lb

## 2024-06-27 DIAGNOSIS — F419 Anxiety disorder, unspecified: Secondary | ICD-10-CM | POA: Diagnosis not present

## 2024-06-27 MED ORDER — BUPROPION HCL ER (XL) 300 MG PO TB24: 300.0000 mg | ORAL_TABLET | Freq: Every day | ORAL | 0 refills | Status: DC

## 2024-06-27 NOTE — Progress Notes (Addendum)
 Virtual Visit via Video Note  I connected with Denise Beltran on 06/27/24 at 1:16 PM by a video enabled telemedicine application and verified that I am speaking with the correct person using two identifiers.   Patient Location: Work  Dispensing optician: office - [place].    I discussed the limitations, risks, security and privacy concerns of performing an evaluation and management service by telephone and the availability of in person appointments. I also discussed with the patient that there may be a patient responsible charge related to this service. The patient expressed understanding and agreed to proceed, consent obtained  Chief Complaint  Patient presents with   Medical Management of Chronic Issues    1 month follow up     History of Present Illness: Denise Beltran is a 28 y.o. female that is following up on anxiety. At last appointment on 07/29, started Wellbutrin  150mg  XR daily. She reports medication has helped with anxiety. Still feels it could be a little better. She has took the medication previously later in the day and noticed a difference in her anxiety. Denies any SI or HI.  She has not noticed as much improvement with ADHD, with completing task or easily distracted. But reports with her job, she has been doing as much to tell a difference.   Patient Active Problem List   Diagnosis Date Noted   Neutrophilia 06/14/2024   Past Medical History:  Diagnosis Date   Allergy    Seasonal allergies since I was young   Anemia    Anxiety    Since I can remember   Asthma    Clotting disorder (HCC) 2018   bleeding after bowel movement sometimes due to hemorrhoids   Depression    Since I can remember   Eczema    GERD (gastroesophageal reflux disease) 2014   Started in college   Hypertension 2013   Can't really remember when but it's better now   Obesity    Past Surgical History:  Procedure Laterality Date   HERNIA REPAIR  2019   No Known Allergies Prior to Admission  medications   Medication Sig Start Date End Date Taking? Authorizing Provider  albuterol  (VENTOLIN  HFA) 108 (90 Base) MCG/ACT inhaler Inhale 2 puffs into the lungs every 6 (six) hours as needed for wheezing or shortness of breath (Cough). 01/31/24  Yes Billy Philippe SAUNDERS, NP  APPLE CIDER VINEGAR PO Take 480 mg by mouth daily at 6 (six) AM.   Yes [provider]  buPROPion  (WELLBUTRIN  XL) 150 MG 24 hr tablet Take 1 tablet (150 mg total) by mouth daily. 05/30/24  Yes Billy Philippe SAUNDERS, NP  Cyanocobalamin (VITAMIN B-12) 5000 MCG TBDP Take 1 capsule by mouth daily at 6 (six) AM.   Yes [provider]  Ferrous Sulfate Dried 45 MG TBCR Take 1 tablet by mouth daily at 6 (six) AM.   Yes [provider]  magnesium gluconate (MAGONATE) 500 MG tablet Take 500 mg by mouth daily.   Yes [provider]  Multiple Vitamin (MULTIVITAMIN WITH MINERALS) TABS tablet Take 1 tablet by mouth daily.   Yes [provider]  Omega-3 Fatty Acids (FISH OIL) 300 MG CAPS Take 1 capsule by mouth daily at 6 (six) AM.   Yes [provider]  tirzepatide  7.5 MG/0.5ML injection vial Inject 7.5 mg into the skin once a week. 05/02/24  Yes Billy Philippe SAUNDERS, NP  triamcinolone  cream (KENALOG ) 0.1 % Apply 1 Application topically 2 (two) times daily. 03/16/24  Yes Billy Philippe SAUNDERS, NP   Social History   Socioeconomic History   Marital status: Significant Other    Spouse name: Not on file   Number of children: 0   Years of education: Not on file   Highest education level: Bachelor's degree (e.g., BA, AB, BS)  Occupational History   Not on file  Tobacco Use   Smoking status: Light Smoker    Types: E-cigarettes   Smokeless tobacco: Never   Tobacco comments:    Occasionally I'll buy a vape (twice a year)  Vaping Use   Vaping status: Some Days  Substance and Sexual Activity   Alcohol use: No   Drug use: No   Sexual activity: Yes    Birth control/protection: None     Comment: My girlfriend and I dont see a need in birth control  Other Topics Concern   Not on file  Social History Narrative   Not on file   Social Drivers of Health   Financial Resource Strain: Low Risk  (05/29/2024)   Overall Financial Resource Strain (CARDIA)    Difficulty of Paying Living Expenses: Not hard at all  Recent Concern: Financial Resource Strain - Medium Risk (03/11/2024)   Received from [facility]   Overall Financial Resource Strain (CARDIA)    Difficulty of Paying Living Expenses: Somewhat hard  Food Insecurity: No Food Insecurity (06/14/2024)   Hunger Vital Sign    Worried About Running Out of Food in the Last Year: Never true    Ran Out of Food in the Last Year: Never true  Transportation Needs: No Transportation Needs (06/14/2024)   PRAPARE - Administrator, Civil Service (Medical): No    Lack of Transportation (Non-Medical): No  Physical Activity: Sufficiently Active (05/29/2024)   Exercise Vital Sign    Days of Exercise per Week: 3 days    Minutes of Exercise per Session: 60 min  Recent Concern: Physical Activity - Insufficiently Active (03/11/2024)   Received from [facility]   Exercise Vital Sign    On average, how many days per week do you engage in moderate to strenuous exercise (like a brisk walk)?: 2 days    On average, how many minutes do you engage in exercise at this level?: 50 min  Stress: No Stress Concern Present (05/29/2024)   [employer] of Occupational Health - Occupational Stress Questionnaire    Feeling of Stress: Only a little  Social Connections: Socially Isolated (05/29/2024)   Social Connection and Isolation Panel    Frequency of Communication with Friends and Family: More than three times a week    Frequency of Social Gatherings with Friends and Family: Three times a week    Attends Religious Services: Never    Active Member of Clubs or Organizations: No    Attends Banker Meetings: Not on file    Marital  Status: Never married  Intimate Partner Violence: Not At Risk (06/14/2024)   Humiliation, Afraid, Rape, and Kick questionnaire    Fear of Current or Ex-Partner: No    Emotionally Abused: No    Physically Abused: No    Sexually Abused: No    Observations/Objective: Today's Vitals   06/27/24 0905  Weight: 292 lb (132.5 kg)  Height: 5' 4 (1.626 m)  Patient was not able to obtain vital signs due to not having equipment.  Physical Exam Vitals reviewed.  Constitutional:      General: She is not in acute distress.    Appearance:  Normal appearance. She is obese. She is not ill-appearing, toxic-appearing or diaphoretic.  Eyes:     General:        Right eye: No discharge.        Left eye: No discharge.     Conjunctiva/sclera: Conjunctivae normal.  Pulmonary:     Effort: Pulmonary effort is normal. No respiratory distress.  Skin:    General: Skin is dry.  Neurological:     General: No focal deficit present.     Mental Status: She is alert and oriented to person, place, and time. Mental status is at baseline.  Psychiatric:        Mood and Affect: Mood normal.        Behavior: Behavior normal.        Thought Content: Thought content normal.        Judgment: Judgment normal.    Assessment and Plan: Anxiety -     buPROPion  HCl ER (XL); Take 1 tablet (300 mg total) by mouth daily.  Dispense: 90 tablet; Refill: 0  -Anxiety has improved with taking Wellbutrin , but feels like it could improve even more. Increased Wellbutrin  to 300mg  daily. May take the 2 remaining 150mg  tablets tomorrow morning, then pick up new dose.   Follow Up Instructions: Return in about 3 months (around 09/27/2024) for chronic management.   I discussed the assessment and treatment plan with the patient. The patient was provided an opportunity to ask questions and all were answered. The patient agreed with the plan and demonstrated an understanding of the instructions.   The patient was advised to call back or  seek an in-person evaluation if the symptoms worsen or if the condition fails to improve as anticipated.  Lenia Housley, NP

## 2024-06-27 NOTE — Patient Instructions (Addendum)
-  Anxiety has improved with taking Wellbutrin , but feels like it could improve even more. Increased Wellbutrin  to 300mg  daily. May take the 2 remaining 150mg  tablets tomorrow morning, then pick up new dose.  -Follow up in 3 months or sooner if something changes.

## 2024-07-21 ENCOUNTER — Encounter: Payer: Self-pay | Admitting: Family Medicine

## 2024-07-21 DIAGNOSIS — E66813 Obesity, class 3: Secondary | ICD-10-CM

## 2024-07-21 MED ORDER — TIRZEPATIDE-WEIGHT MANAGEMENT 10 MG/0.5ML ~~LOC~~ SOAJ: 10.0000 mg | Freq: Once | SUBCUTANEOUS | Status: DC

## 2024-07-25 ENCOUNTER — Other Ambulatory Visit: Payer: Self-pay | Admitting: Family Medicine

## 2024-07-25 DIAGNOSIS — Z6841 Body Mass Index (BMI) 40.0 and over, adult: Secondary | ICD-10-CM

## 2024-09-15 ENCOUNTER — Other Ambulatory Visit (HOSPITAL_COMMUNITY): Payer: Self-pay

## 2024-09-15 ENCOUNTER — Telehealth: Payer: Self-pay

## 2024-09-15 NOTE — Telephone Encounter (Signed)
 Pharmacy Patient Advocate Encounter   Received notification from Onbase that prior authorization for Zepbound  10 is required/requested.   Insurance verification completed.   The patient is insured through [employer].   Per test claim: The current 28 day co-pay is, $25.00.  No PA needed at this time. This test claim was processed through [facility]- copay amounts may vary at other pharmacies due to pharmacy/plan contracts, or as the patient moves through the different stages of their insurance plan.    Patient current PA expires 10/12/24. Can resubmit for renewal closer to that time.

## 2024-09-20 ENCOUNTER — Other Ambulatory Visit (HOSPITAL_COMMUNITY): Payer: Self-pay

## 2024-09-21 ENCOUNTER — Ambulatory Visit: Admitting: Family Medicine

## 2024-09-21 VITALS — BP 136/86 | HR 88 | Temp 98.2°F | Ht 64.0 in | Wt 276.6 lb

## 2024-09-21 DIAGNOSIS — Z23 Encounter for immunization: Secondary | ICD-10-CM | POA: Diagnosis not present

## 2024-09-21 DIAGNOSIS — Z8679 Personal history of other diseases of the circulatory system: Secondary | ICD-10-CM

## 2024-09-21 DIAGNOSIS — E66813 Obesity, class 3: Secondary | ICD-10-CM

## 2024-09-21 DIAGNOSIS — Z6841 Body Mass Index (BMI) 40.0 and over, adult: Secondary | ICD-10-CM

## 2024-09-21 DIAGNOSIS — F419 Anxiety disorder, unspecified: Secondary | ICD-10-CM | POA: Diagnosis not present

## 2024-09-21 MED ORDER — BUPROPION HCL ER (XL) 300 MG PO TB24: 300.0000 mg | ORAL_TABLET | Freq: Every day | ORAL | 1 refills | Status: AC

## 2024-09-21 MED ORDER — ZEPBOUND 12.5 MG/0.5ML ~~LOC~~ SOAJ: 12.5000 mg | SUBCUTANEOUS | 0 refills | Status: DC

## 2024-09-21 NOTE — Progress Notes (Signed)
 Established Patient Office Visit   Subjective:  Patient ID: Denise Beltran, female    DOB: [birth year]  Age: 28 y.o. MRN: [id number]  Chief Complaint  Patient presents with   Medical Management of Chronic Issues    Pt is here with mom.    Foot Pain    Pt c/o L foot. Think she sprain her tendon. Hurts to walk. Have a f/u tomorrow.     HPI: Patient is following on obesity and anxiety.  Obesity: She is taking Zepbound  10mg  injection every week. She has had 7 injections with tomorrow being the 8th injection at this dose. Exercise: Not doing as much with foot, but still going to gym on her off days with doing cardio and weight training. Diet: Cutting down on fast foods. High protein with shakes, yogurt, nuts, and meats.  Wt Readings from Last 3 Encounters:  09/21/24 276 lb 9.6 oz (125.5 kg)  06/27/24 292 lb (132.5 kg)  06/14/24 298 lb 3.2 oz (135.3 kg)    Anxiety: She is taking Wellbutrin  XR 300mg  daily. She reports it has helped. Not having as many sad days.  ROS See HPI above     Objective:   BP 136/86 (BP Location: Left Arm, Patient Position: Sitting, Cuff Size: Large)   Pulse 88   Temp 98.2 F (36.8 C) (Oral)   Ht 5' 4 (1.626 m)   Wt 276 lb 9.6 oz (125.5 kg)   LMP 09/14/2024 (Exact Date)   SpO2 98%   BMI 47.48 kg/m  BP Readings from Last 3 Encounters:  09/21/24 136/86  06/14/24 128/83  05/30/24 120/74      Physical Exam Vitals reviewed.  Constitutional:      General: She is not in acute distress.    Appearance: Normal appearance. She is morbidly obese. She is not ill-appearing, toxic-appearing or diaphoretic.  HENT:     Head: Normocephalic and atraumatic.  Eyes:     General:        Right eye: No discharge.        Left eye: No discharge.     Conjunctiva/sclera: Conjunctivae normal.  Cardiovascular:     Rate and Rhythm: Normal rate and regular rhythm.     Heart sounds: Normal heart sounds. No murmur heard.    No friction rub. No gallop.  Pulmonary:      Effort: Pulmonary effort is normal. No respiratory distress.     Breath sounds: Normal breath sounds.  Musculoskeletal:        General: Normal range of motion.  Skin:    General: Skin is warm and dry.  Neurological:     General: No focal deficit present.     Mental Status: She is alert and oriented to person, place, and time. Mental status is at baseline.  Psychiatric:        Mood and Affect: Mood normal.        Behavior: Behavior normal.        Thought Content: Thought content normal.        Judgment: Judgment normal.      Assessment & Plan:  Anxiety -     buPROPion  HCl ER (XL); Take 1 tablet (300 mg total) by mouth daily.  Dispense: 90 tablet; Refill: 1  Class 3 severe obesity due to excess calories without serious comorbidity with body mass index (BMI) of 50.0 to 59.9 in adult (HCC) -     Zepbound ; Inject 12.5 mg into the skin once a week for 28 days.  Dispense: 2 mL; Refill: 0  Influenza vaccine needed -     Flu vaccine trivalent PF, 6mos and older(Flulaval,Afluria,Fluarix,Fluzone)  H/O: HTN (hypertension)  -Influenza vaccine provided.  -Continue with Bupropion  XL (Wellbutrin ) 300mg  daily for anxiety since it has improved your mood. Refilled medication.  -Congratulated on weight loss. Take last injection of Zepbound  10mg  tomorrow. Increased Zepbound  to 12.5mg  injection every 7 days for 4 weeks. At the last injection, send a message of progress and weither to go back down to 10mg , stay at 12.5mg , or increase to 15mg .  -Blood pressure is elevated today with a history of hypertension, but not taking blood pressure medication. Recommend to monitor blood pressure twice a day for 2 weeks to confirm that it is staying at goal. Also, recommend to only use Coricidin HBP for cold symptoms instead of Dayquil/ Nyquil or other medications over the counter for cold symptoms.   Return in about 2 weeks (around 10/05/2024) for follow-up.   Marciel Offenberger, NP

## 2024-09-21 NOTE — Patient Instructions (Signed)
-  It was good to see you today! -Influenza vaccine provided.  -Continue with Bupropion  XL (Wellbutrin ) 300mg  daily for anxiety. Refilled medication.  -Congratulations on weight loss! Take last injection of Zepbound  10mg  tomorrow. Increased Zepbound  to 12.5mg  injection every 7 days for 4 weeks. At the last injection, send a message of progress and weither to go back down to 10mg , stay at 12.5mg , or increase to 15mg .  -Blood pressure is elevated today with a history of hypertension, but not taking blood pressure medication. Recommend to monitor your blood pressure twice a day for 2 weeks to confirm that it is staying at goal. Also, recommend to only use Coricidin HBP for cold symptoms instead of Dayquil/ Nyquil or other medications over the counter for cold symptoms.  -Follow up in 2 weeks.

## 2024-09-22 ENCOUNTER — Ambulatory Visit
Admission: RE | Admit: 2024-09-22 | Discharge: 2024-09-22 | Disposition: A | Discharge status: Home / Self Care | Source: Ambulatory Visit | Attending: General Practice | Admitting: General Practice

## 2024-09-22 ENCOUNTER — Other Ambulatory Visit: Payer: Self-pay | Admitting: General Practice

## 2024-09-22 DIAGNOSIS — S93602A Unspecified sprain of left foot, initial encounter: Secondary | ICD-10-CM

## 2024-09-22 DIAGNOSIS — S92352A Displaced fracture of fifth metatarsal bone, left foot, initial encounter for closed fracture: Secondary | ICD-10-CM | POA: Diagnosis not present

## 2024-10-10 ENCOUNTER — Ambulatory Visit: Admitting: Family Medicine

## 2024-10-11 ENCOUNTER — Encounter: Payer: Self-pay | Admitting: Family Medicine

## 2024-10-12 ENCOUNTER — Ambulatory Visit: Admitting: Family Medicine

## 2024-10-13 ENCOUNTER — Other Ambulatory Visit: Payer: Self-pay | Admitting: Family Medicine

## 2024-10-13 DIAGNOSIS — X58XXXA Exposure to other specified factors, initial encounter: Secondary | ICD-10-CM | POA: Diagnosis not present

## 2024-10-13 DIAGNOSIS — R2242 Localized swelling, mass and lump, left lower limb: Secondary | ICD-10-CM | POA: Diagnosis not present

## 2024-10-13 DIAGNOSIS — F331 Major depressive disorder, recurrent, moderate: Secondary | ICD-10-CM | POA: Diagnosis not present

## 2024-10-13 DIAGNOSIS — F419 Anxiety disorder, unspecified: Secondary | ICD-10-CM | POA: Diagnosis not present

## 2024-10-13 DIAGNOSIS — J45909 Unspecified asthma, uncomplicated: Secondary | ICD-10-CM | POA: Diagnosis not present

## 2024-10-13 DIAGNOSIS — E66813 Obesity, class 3: Secondary | ICD-10-CM

## 2024-10-13 DIAGNOSIS — M79672 Pain in left foot: Secondary | ICD-10-CM | POA: Diagnosis not present

## 2024-10-13 DIAGNOSIS — Z6841 Body Mass Index (BMI) 40.0 and over, adult: Secondary | ICD-10-CM | POA: Diagnosis not present

## 2024-10-13 DIAGNOSIS — S92352A Displaced fracture of fifth metatarsal bone, left foot, initial encounter for closed fracture: Secondary | ICD-10-CM | POA: Diagnosis not present

## 2024-10-13 DIAGNOSIS — Z7985 Long-term (current) use of injectable non-insulin antidiabetic drugs: Secondary | ICD-10-CM | POA: Diagnosis not present

## 2024-10-13 DIAGNOSIS — L720 Epidermal cyst: Secondary | ICD-10-CM | POA: Diagnosis not present

## 2024-10-13 DIAGNOSIS — K219 Gastro-esophageal reflux disease without esophagitis: Secondary | ICD-10-CM | POA: Diagnosis not present

## 2024-10-13 DIAGNOSIS — Z87891 Personal history of nicotine dependence: Secondary | ICD-10-CM | POA: Diagnosis not present

## 2024-10-17 ENCOUNTER — Other Ambulatory Visit (HOSPITAL_COMMUNITY): Payer: Self-pay

## 2024-10-17 ENCOUNTER — Telehealth: Payer: Self-pay | Admitting: Pharmacy Technician

## 2024-10-17 NOTE — Telephone Encounter (Signed)
 Pharmacy Patient Advocate Encounter  Received notification from [facility] that Prior Authorization for Zepbound  12.5MG /0.5ML pen-injectors   has been APPROVED from 10/17/2024 to 10/17/2025. Ran test claim, Copay is $25.00. This test claim was processed through [facility]- copay amounts may vary at other pharmacies due to pharmacy/plan contracts, or as the patient moves through the different stages of their insurance plan.   PA #/Case ID/Reference #: [id number]  [id number]

## 2024-10-30 DIAGNOSIS — S92352D Displaced fracture of fifth metatarsal bone, left foot, subsequent encounter for fracture with routine healing: Secondary | ICD-10-CM | POA: Diagnosis not present

## 2024-11-09 ENCOUNTER — Encounter: Payer: Self-pay | Admitting: Family Medicine

## 2024-11-09 DIAGNOSIS — F909 Attention-deficit hyperactivity disorder, unspecified type: Secondary | ICD-10-CM

## 2024-11-23 ENCOUNTER — Other Ambulatory Visit: Payer: Self-pay | Admitting: Family Medicine

## 2024-11-23 DIAGNOSIS — E66813 Obesity, class 3: Secondary | ICD-10-CM

## 2024-11-24 ENCOUNTER — Other Ambulatory Visit: Payer: Self-pay

## 2024-11-24 DIAGNOSIS — F909 Attention-deficit hyperactivity disorder, unspecified type: Secondary | ICD-10-CM

## 2024-12-08 ENCOUNTER — Other Ambulatory Visit (HOSPITAL_COMMUNITY): Payer: Self-pay
